# Patient Record
Sex: Female | Born: 1960 | Race: White | Hispanic: No | Marital: Married | Smoking: Never smoker
Health system: Southern US, Community
[De-identification: ages and names within clinical notes are randomized; demographics above are authoritative.]

## PROBLEM LIST (undated history)

## (undated) DIAGNOSIS — I10 Essential (primary) hypertension: Secondary | ICD-10-CM

## (undated) DIAGNOSIS — M8580 Other specified disorders of bone density and structure, unspecified site: Secondary | ICD-10-CM

## (undated) DIAGNOSIS — B001 Herpesviral vesicular dermatitis: Secondary | ICD-10-CM

## (undated) DIAGNOSIS — E079 Disorder of thyroid, unspecified: Secondary | ICD-10-CM

## (undated) DIAGNOSIS — N952 Postmenopausal atrophic vaginitis: Secondary | ICD-10-CM

## (undated) DIAGNOSIS — M810 Age-related osteoporosis without current pathological fracture: Secondary | ICD-10-CM

## (undated) HISTORY — DX: Essential (primary) hypertension: I10

## (undated) HISTORY — PX: CERVICAL BIOPSY  W/ LOOP ELECTRODE EXCISION: SUR135

## (undated) HISTORY — PX: CHOLECYSTECTOMY: SHX55

## (undated) HISTORY — PX: ABDOMINAL HYSTERECTOMY: SHX81

## (undated) HISTORY — PX: COLPOSCOPY: SHX161

## (undated) HISTORY — DX: Herpesviral vesicular dermatitis: B00.1

## (undated) HISTORY — DX: Other specified disorders of bone density and structure, unspecified site: M85.80

## (undated) HISTORY — DX: Postmenopausal atrophic vaginitis: N95.2

## (undated) HISTORY — PX: BACK SURGERY: SHX140

## (undated) HISTORY — DX: Age-related osteoporosis without current pathological fracture: M81.0

## (undated) HISTORY — PX: TONSILLECTOMY AND ADENOIDECTOMY: SHX28

## (undated) HISTORY — DX: Disorder of thyroid, unspecified: E07.9

---

## 1992-10-30 HISTORY — PX: ABDOMINAL HYSTERECTOMY: SHX81

## 1992-10-30 HISTORY — PX: TOTAL ABDOMINAL HYSTERECTOMY W/ BILATERAL SALPINGOOPHORECTOMY: SHX83

## 2005-03-07 ENCOUNTER — Ambulatory Visit: Payer: Self-pay | Admitting: Unknown Physician Specialty

## 2006-08-16 ENCOUNTER — Ambulatory Visit: Payer: Self-pay | Admitting: Unknown Physician Specialty

## 2007-02-02 ENCOUNTER — Ambulatory Visit: Payer: Self-pay | Admitting: Unknown Physician Specialty

## 2008-01-09 ENCOUNTER — Ambulatory Visit: Payer: Self-pay | Admitting: Unknown Physician Specialty

## 2008-01-14 ENCOUNTER — Ambulatory Visit: Payer: Self-pay | Admitting: Unknown Physician Specialty

## 2008-07-20 ENCOUNTER — Ambulatory Visit: Payer: Self-pay | Admitting: Unknown Physician Specialty

## 2009-07-27 ENCOUNTER — Ambulatory Visit: Payer: Self-pay | Admitting: Unknown Physician Specialty

## 2009-08-16 ENCOUNTER — Ambulatory Visit: Payer: Self-pay | Admitting: Unknown Physician Specialty

## 2010-08-09 ENCOUNTER — Ambulatory Visit: Payer: Self-pay | Admitting: Unknown Physician Specialty

## 2010-10-30 HISTORY — PX: COLONOSCOPY: SHX174

## 2011-04-24 ENCOUNTER — Ambulatory Visit: Payer: Self-pay | Admitting: Gastroenterology

## 2012-04-18 ENCOUNTER — Ambulatory Visit: Payer: Self-pay | Admitting: Unknown Physician Specialty

## 2012-10-30 DIAGNOSIS — M8580 Other specified disorders of bone density and structure, unspecified site: Secondary | ICD-10-CM

## 2012-10-30 HISTORY — DX: Other specified disorders of bone density and structure, unspecified site: M85.80

## 2014-07-05 ENCOUNTER — Inpatient Hospital Stay: Payer: Self-pay | Admitting: Surgery

## 2014-07-05 LAB — COMPREHENSIVE METABOLIC PANEL
ALK PHOS: 64 U/L
ANION GAP: 5 — AB (ref 7–16)
Albumin: 3 g/dL — ABNORMAL LOW (ref 3.4–5.0)
BILIRUBIN TOTAL: 0.3 mg/dL (ref 0.2–1.0)
BUN: 18 mg/dL (ref 7–18)
CREATININE: 1.12 mg/dL (ref 0.60–1.30)
Calcium, Total: 8.8 mg/dL (ref 8.5–10.1)
Chloride: 102 mmol/L (ref 98–107)
Co2: 31 mmol/L (ref 21–32)
EGFR (Non-African Amer.): 57 — ABNORMAL LOW
GLUCOSE: 88 mg/dL (ref 65–99)
Osmolality: 277 (ref 275–301)
Potassium: 3.6 mmol/L (ref 3.5–5.1)
SGOT(AST): 22 U/L (ref 15–37)
SGPT (ALT): 27 U/L
SODIUM: 138 mmol/L (ref 136–145)
TOTAL PROTEIN: 7.6 g/dL (ref 6.4–8.2)

## 2014-07-05 LAB — CBC WITH DIFFERENTIAL/PLATELET
BASOS ABS: 0 10*3/uL (ref 0.0–0.1)
BASOS PCT: 0.3 %
EOS ABS: 0.4 10*3/uL (ref 0.0–0.7)
EOS PCT: 3.1 %
HCT: 40 % (ref 35.0–47.0)
HGB: 13 g/dL (ref 12.0–16.0)
LYMPHS ABS: 3.2 10*3/uL (ref 1.0–3.6)
LYMPHS PCT: 23.8 %
MCH: 28.4 pg (ref 26.0–34.0)
MCHC: 32.4 g/dL (ref 32.0–36.0)
MCV: 88 fL (ref 80–100)
Monocyte #: 1 x10 3/mm — ABNORMAL HIGH (ref 0.2–0.9)
Monocyte %: 7.9 %
Neutrophil #: 8.6 10*3/uL — ABNORMAL HIGH (ref 1.4–6.5)
Neutrophil %: 64.9 %
PLATELETS: 352 10*3/uL (ref 150–440)
RBC: 4.56 10*6/uL (ref 3.80–5.20)
RDW: 12.5 % (ref 11.5–14.5)
WBC: 13.3 10*3/uL — ABNORMAL HIGH (ref 3.6–11.0)

## 2014-07-05 LAB — URINALYSIS, COMPLETE
BILIRUBIN, UR: NEGATIVE
Bacteria: NONE SEEN
Glucose,UR: NEGATIVE mg/dL (ref 0–75)
Ketone: NEGATIVE
NITRITE: NEGATIVE
Ph: 5 (ref 4.5–8.0)
Protein: NEGATIVE
RBC,UR: 20 /HPF (ref 0–5)
Specific Gravity: 1.028 (ref 1.003–1.030)
Squamous Epithelial: 7
WBC UR: 3 /HPF (ref 0–5)

## 2014-07-05 LAB — LIPASE, BLOOD: LIPASE: 134 U/L (ref 73–393)

## 2014-07-06 LAB — COMPREHENSIVE METABOLIC PANEL
ALK PHOS: 97 U/L
ANION GAP: 8 (ref 7–16)
AST: 74 U/L — AB (ref 15–37)
Albumin: 2.4 g/dL — ABNORMAL LOW (ref 3.4–5.0)
BILIRUBIN TOTAL: 0.3 mg/dL (ref 0.2–1.0)
BUN: 11 mg/dL (ref 7–18)
CALCIUM: 7.6 mg/dL — AB (ref 8.5–10.1)
CHLORIDE: 107 mmol/L (ref 98–107)
CO2: 27 mmol/L (ref 21–32)
Creatinine: 0.89 mg/dL (ref 0.60–1.30)
EGFR (African American): >60
GLUCOSE: 92 mg/dL (ref 65–99)
Osmolality: 282 (ref 275–301)
Potassium: 3.5 mmol/L (ref 3.5–5.1)
SGPT (ALT): 50 U/L
Sodium: 142 mmol/L (ref 136–145)
Total Protein: 6.1 g/dL — ABNORMAL LOW (ref 6.4–8.2)

## 2014-07-06 LAB — CBC WITH DIFFERENTIAL/PLATELET
BASOS PCT: 0.3 %
Basophil #: 0 10*3/uL (ref 0.0–0.1)
EOS PCT: 2.5 %
Eosinophil #: 0.2 10*3/uL (ref 0.0–0.7)
HCT: 33.6 % — ABNORMAL LOW (ref 35.0–47.0)
HGB: 11 g/dL — AB (ref 12.0–16.0)
LYMPHS PCT: 25 %
Lymphocyte #: 2.4 10*3/uL (ref 1.0–3.6)
MCH: 28.7 pg (ref 26.0–34.0)
MCHC: 32.6 g/dL (ref 32.0–36.0)
MCV: 88 fL (ref 80–100)
Monocyte #: 0.9 x10 3/mm (ref 0.2–0.9)
Monocyte %: 9.4 %
NEUTROS ABS: 6.1 10*3/uL (ref 1.4–6.5)
Neutrophil %: 62.8 %
Platelet: 238 10*3/uL (ref 150–440)
RBC: 3.82 10*6/uL (ref 3.80–5.20)
RDW: 12.3 % (ref 11.5–14.5)
WBC: 9.7 10*3/uL (ref 3.6–11.0)

## 2014-07-07 LAB — CBC WITH DIFFERENTIAL/PLATELET
BASOS ABS: 0 10*3/uL (ref 0.0–0.1)
Basophil %: 0 %
Eosinophil #: 0 10*3/uL (ref 0.0–0.7)
Eosinophil %: 0 %
HCT: 36 % (ref 35.0–47.0)
HGB: 11.8 g/dL — ABNORMAL LOW (ref 12.0–16.0)
LYMPHS ABS: 1.2 10*3/uL (ref 1.0–3.6)
Lymphocyte %: 9.5 %
MCH: 28.1 pg (ref 26.0–34.0)
MCHC: 32.7 g/dL (ref 32.0–36.0)
MCV: 86 fL (ref 80–100)
Monocyte #: 0.7 x10 3/mm (ref 0.2–0.9)
Monocyte %: 5.5 %
NEUTROS ABS: 10.6 10*3/uL — AB (ref 1.4–6.5)
Neutrophil %: 85 %
PLATELETS: 312 10*3/uL (ref 150–440)
RBC: 4.19 10*6/uL (ref 3.80–5.20)
RDW: 11.7 % (ref 11.5–14.5)
WBC: 12.5 10*3/uL — ABNORMAL HIGH (ref 3.6–11.0)

## 2014-07-07 LAB — COMPREHENSIVE METABOLIC PANEL
ALBUMIN: 2.8 g/dL — AB (ref 3.4–5.0)
Alkaline Phosphatase: 153 U/L — ABNORMAL HIGH
Anion Gap: 6 — ABNORMAL LOW (ref 7–16)
BUN: 9 mg/dL (ref 7–18)
Bilirubin,Total: 0.3 mg/dL (ref 0.2–1.0)
CALCIUM: 8.8 mg/dL (ref 8.5–10.1)
CO2: 28 mmol/L (ref 21–32)
CREATININE: 0.88 mg/dL (ref 0.60–1.30)
Chloride: 102 mmol/L (ref 98–107)
GLUCOSE: 144 mg/dL — AB (ref 65–99)
OSMOLALITY: 273 (ref 275–301)
Potassium: 4.1 mmol/L (ref 3.5–5.1)
SGOT(AST): 69 U/L — ABNORMAL HIGH (ref 15–37)
SGPT (ALT): 73 U/L — ABNORMAL HIGH
Sodium: 136 mmol/L (ref 136–145)
TOTAL PROTEIN: 7.1 g/dL (ref 6.4–8.2)

## 2014-07-08 LAB — COMPREHENSIVE METABOLIC PANEL
ALBUMIN: 2.3 g/dL — AB (ref 3.4–5.0)
ALK PHOS: 103 U/L
Anion Gap: 7 (ref 7–16)
BILIRUBIN TOTAL: 0.2 mg/dL (ref 0.2–1.0)
BUN: 11 mg/dL (ref 7–18)
Calcium, Total: 8.1 mg/dL — ABNORMAL LOW (ref 8.5–10.1)
Chloride: 106 mmol/L (ref 98–107)
Co2: 28 mmol/L (ref 21–32)
Creatinine: 0.9 mg/dL (ref 0.60–1.30)
EGFR (African American): >60
EGFR (Non-African Amer.): >60
Glucose: 81 mg/dL (ref 65–99)
Osmolality: 280 (ref 275–301)
Potassium: 3.6 mmol/L (ref 3.5–5.1)
SGOT(AST): 36 U/L (ref 15–37)
SGPT (ALT): 47 U/L
Sodium: 141 mmol/L (ref 136–145)
Total Protein: 6.1 g/dL — ABNORMAL LOW (ref 6.4–8.2)

## 2014-07-08 LAB — CBC WITH DIFFERENTIAL/PLATELET
BASOS ABS: 0 10*3/uL (ref 0.0–0.1)
Basophil %: 0.4 %
Eosinophil #: 0.2 10*3/uL (ref 0.0–0.7)
Eosinophil %: 1.5 %
HCT: 31.4 % — ABNORMAL LOW (ref 35.0–47.0)
HGB: 10.6 g/dL — ABNORMAL LOW (ref 12.0–16.0)
LYMPHS PCT: 39 %
Lymphocyte #: 3.9 10*3/uL — ABNORMAL HIGH (ref 1.0–3.6)
MCH: 29 pg (ref 26.0–34.0)
MCHC: 33.8 g/dL (ref 32.0–36.0)
MCV: 86 fL (ref 80–100)
MONO ABS: 0.6 x10 3/mm (ref 0.2–0.9)
MONOS PCT: 6 %
NEUTROS PCT: 53.1 %
Neutrophil #: 5.3 10*3/uL (ref 1.4–6.5)
Platelet: 260 10*3/uL (ref 150–440)
RBC: 3.67 10*6/uL — ABNORMAL LOW (ref 3.80–5.20)
RDW: 12.1 % (ref 11.5–14.5)
WBC: 10 10*3/uL (ref 3.6–11.0)

## 2014-07-09 LAB — PATHOLOGY REPORT

## 2015-02-20 NOTE — Op Note (Signed)
PATIENT NAME:  Diane Gross, Diane Gross MR#:  [id number] DATE OF BIRTH:  Oct 06, 1961  DATE OF PROCEDURE:  07/06/2014  PREOPERATIVE DIAGNOSIS: Acute cholecystitis.   POSTOPERATIVE DIAGNOSIS: Acute cholecystitis.   PROCEDURE: Laparoscopic cholecystectomy.   SURGEON: Dionne Miloichard Zanasia Hickson, MD   ANESTHESIA: General with endotracheal tube.   INDICATIONS: This is a patient with unrelenting right upper quadrant pain and a work-up showing acute cholecystitis with a gallstone lodged in the neck of the gallbladder. Preoperatively, we discussed rationale for surgery, the options of observation, risk of bleeding, infection, recurrence of symptoms, failure to resolve her symptoms, open procedure, bile duct damage, bile duct leak, and retained common bile duct stone, any of which could require further surgery and/or ERCP, stent, and papillotomy. This was reviewed for her. She understood and agreed to proceed.   FINDINGS: Acute cholecystitis, large gallstone in the neck of the gallbladder, extensive fibrous scarification of the gallbladder fossa and posterior wall of the gallbladder.   DESCRIPTION OF PROCEDURE: The patient was induced to general anesthesia. She was on IV antibiotics. She was prepped and draped in sterile fashion. VTE prophylaxis was in place.   Local anesthetic was infiltrated in the skin and subcutaneous tissues around the periumbilical area. Incision was made. A Veress needle was placed. Pneumoperitoneum was obtained and a 5 mm trocar port was placed under direct vision. A 10 mm epigastric port and two lateral 5 mm ports were placed, and the abdominal cavity was explored. There were some adhesions in the infraumbilical area away from the port site with no sign of bowel injury.   The gallbladder was placed on tension. It was found to be greatly edematous and swollen. Adhesions were present that were taken down bluntly without the use of energy. The infundibulum was identified. The cystic artery was well  identified, doubly clipped, and divided. This allowed for good visualization of a small cystic duct entering the infundibulum of the gallbladder, where there was a stone firmly lodged in the neck of the gallbladder in the infundibulum. The cystic duct was doubly clipped and divided and then the gallbladder segment of the gallbladder fossa. Minimal, if any, electrocautery was used in the infundibular area and cephalad until the fundus was reached because of extensive thick fibrous tissue in the posterior wall of the gallbladder and gallbladder fossa. Electrocautery was used at the fundus area which did not seem to be involved with this chronic inflammation. Hemostasis was with electrocautery. The gallbladder was passed out through the epigastric port site with the aid of an Endo Catch bag requiring enlargement of the epigastric site due to the large size of the gallstone and thickened gallbladder.   The area was checked for hemostasis and found to be adequate. There was no sign of bleeding. No bile leak. No bowel injury. Again, the camera was placed in the epigastric site to view back the periumbilical site. There was no sign of bowel injury. Therefore, pneumoperitoneum was released. All ports were removed. Fascial edges at the enlarged epigastric port site were approximated with figure-of-eight 0 Vicryls and then 4-0 subcuticular Monocryl was used on all skin edges. Steri-Strips, Mastisol, and sterile dressings were placed.   The patient tolerated the procedure well. There were no complications. She was taken to the recovery room in stable condition to be admitted for continued care.   ____________________________ Adah Salvageichard E. Excell Seltzerooper, MD rec:nb D: 07/06/2014 15:55:13 ET T: 07/06/2014 18:08:06 ET JOB#: [id number]  cc: Adah Salvageichard E. Excell Seltzerooper, MD, <Dictator> Lattie HawICHARD E Cerina Leary MD ELECTRONICALLY SIGNED 07/07/2014  9:02 

## 2015-02-20 NOTE — H&P (Signed)
History of Present Illness 54 yof who has been sick for 4 days (since Wednesday). She has had worsening RUQ pain, N, V, and anorexia (until just now). Possible low grade fever initially. No jaundice Sx.   Past Med/Surgical Hx:  Hypothyroidism:   ALLERGIES:  NKA: None  HOME MEDICATIONS: Medication Instructions Status  metroNIDAZOLE 500 mg oral tablet  orally every 8 hours Active  hyoscyamine 0.125 mg oral tablet 1 tab(s) orally every 8 hours Active  famotidine 20 mg oral tablet 1 tab(s) orally 2 times a day Active  Premarin 0.625 mg oral tablet 1 tab(s) orally once a day Active  Synthroid 50 mcg (0.05 mg) oral tablet 1 tab(s) orally once a day Active   Family and Social History:  Family History Non-Contributory   Social History negative tobacco, negative ETOH, negative Illicit drugs, married, 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, HR head for [employer]   Place of Living Home   Review of Systems:  Fever/Chills No   Cough No   Sputum No   Abdominal Pain Yes   Diarrhea No   Constipation No   Nausea/Vomiting Yes   SOB/DOE No   Chest Pain No   Dysuria No   Tolerating PT Yes   Tolerating Diet No  Nauseated  Vomiting  last meal 10:00 AM today   Physical Exam:  GEN well developed, well nourished, no acute distress   HEENT pink conjunctivae, PERRL, hearing intact to voice, dry oral mucosa, Oropharynx clear, good dentition   NECK supple  trachea midline   RESP normal resp effort  no use of accessory muscles   CARD regular rate  no murmur  no thrills  no JVD  no Rub   ABD positive tenderness  RUQ tenderness   EXTR negative cyanosis/clubbing, negative edema   SKIN normal to palpation, skin turgor good   NEURO cranial nerves intact, negative tremor, follows commands, motor/sensory function intact   PSYCH alert, A+O to time, place, person, good insight   Lab Results: Hepatic:  06-Sep-15 14:09   Bilirubin, Total 0.3  Alkaline Phosphatase 64 (46-116 NOTE: New Reference  Range 05/19/14)  SGPT (ALT) 27 (14-63 NOTE: New Reference Range 05/19/14)  SGOT (AST) 22  Total Protein, Serum 7.6  Albumin, Serum  3.0  Routine Chem:  06-Sep-15 14:09   Glucose, Serum 88  BUN 18  Creatinine (comp) 1.12  Sodium, Serum 138  Potassium, Serum 3.6  Chloride, Serum 102  CO2, Serum 31  Calcium (Total), Serum 8.8  Osmolality (calc) 277  eGFR (African American) >60  eGFR (Non-African American)  57 (eGFR values <34mL/min/1.73 m2 may be an indication of chronic kidney disease (CKD). Calculated eGFR is useful in patients with stable renal function. The eGFR calculation will not be reliable in acutely ill patients when serum creatinine is changing rapidly. It is not useful in  patients on dialysis. The eGFR calculation may not be applicable to patients at the low and high extremes of body sizes, pregnant women, and vegetarians.)  Anion Gap  5  Lipase 134 (Result(s) reported on 05 Jul 2014 at 02:59PM.)  Routine UA:  06-Sep-15 14:09   Color (UA) Yellow  Clarity (UA) Hazy  Glucose (UA) Negative  Bilirubin (UA) Negative  Ketones (UA) Negative  Specific Gravity (UA) 1.028  Blood (UA) 3+  pH (UA) 5.0  Protein (UA) Negative  Nitrite (UA) Negative  Leukocyte Esterase (UA) Trace (Result(s) reported on 05 Jul 2014 at 02:46PM.)  RBC (UA) 20 /HPF  WBC (UA)  3 /HPF  Bacteria (UA) NONE SEEN  Epithelial Cells (UA) 7 /HPF  Mucous (UA) PRESENT (Result(s) reported on 05 Jul 2014 at 02:46PM.)  Routine Hem:  06-Sep-15 14:09   WBC (CBC)  13.3  RBC (CBC) 4.56  Hemoglobin (CBC) 13.0  Hematocrit (CBC) 40.0  Platelet Count (CBC) 352  MCV 88  MCH 28.4  MCHC 32.4  RDW 12.5  Neutrophil % 64.9  Lymphocyte % 23.8  Monocyte % 7.9  Eosinophil % 3.1  Basophil % 0.3  Neutrophil #  8.6  Lymphocyte # 3.2  Monocyte #  1.0  Eosinophil # 0.4  Basophil # 0.0 (Result(s) reported on 05 Jul 2014 at 02:46PM.)   Radiology Results: [place]:    06-Sep-15 17:12, US Abdomen Limited Survey  US  Abdomen Limited Survey  REASON FOR EXAM:    epigastric pain w/ N/V  COMMENTS:   Body Site: Gallbladder, Liver, Common Bile Duct    PROCEDURE: [place]  - US ABDOMEN LIMITED SURVEY  - Jul 05 2014  5:12PM     CLINICAL DATA:  Epigastric pain with nausea and vomiting.    EXAM:  US ABDOMEN LIMITED - RIGHT UPPER QUADRANT    COMPARISON:  None.    FINDINGS:  Gallbladder:  Cholelithiasis with two moderate size gallstones present with 1 non  mobile stone at the gallbladder neck measuring 2.2 cm. Mild  gallbladder wall thickening measuring 4.8 mm. No adjacent free  fluid. Positive sonographic Murphy sign.    Common bile duct:    Diameter: 4.1 mm.    Liver:    No focal lesion identified. Within normal limits in parenchymal  echogenicity.     IMPRESSION:  Moderate cholelithiasis with a 2.2 cm stone over the gallbladder  neck. Mild associated gallbladder wall thickening with positive  sonographic Murphy's sign. Findings may be due to acute  cholecystitis.      Electronically Signed    By: Marin Olp M.D.    On: 07/05/2014 17:21         Verified By: Pearletha Alfred, M.D.,  LabUnknown:  PACS Image    Assessment/Admission Diagnosis Acute cholecystitis   Plan Admit, IVF, ABx Lap CCY Pt and husband understand low likelihood of converting to open procedure, reasoning for such, and 1/200 risk of CBD injury (and severity of such); she agrees.   Electronic Signatures: Consuela Mimes (MD)  (Signed 06-Sep-15 18:30)  Authored: CHIEF COMPLAINT and HISTORY, PAST MEDICAL/SURGIAL HISTORY, ALLERGIES, HOME MEDICATIONS, FAMILY AND SOCIAL HISTORY, REVIEW OF SYSTEMS, PHYSICAL EXAM, LABS, Radiology, ASSESSMENT AND PLAN   Last Updated: 06-Sep-15 18:30 by Consuela Mimes (MD)

## 2015-02-20 NOTE — Discharge Summary (Signed)
PATIENT NAME:  Diane Gross, Diane Gross MR#:  [id number] DATE OF BIRTH:  07/16/1961  DATE OF ADMISSION:  07/05/2014 DATE OF DISCHARGE:  07/08/2014  DIAGNOSES: Hypothyroidism, acute cholecystitis with cholelithiasis.  PROCEDURE: Laparoscopic cholecystectomy.   HISTORY OF PRESENT ILLNESS AND HOSPITAL COURSE: This is a patient with abdominal pain in the right upper quadrant and work-up showing acute cholecystitis. She was taken to the Operating Room where laparoscopic cholecystectomy was performed without incident.  She made an uncomplicated postoperative recovery, was treated with IV antibiotics, and was discharged in stable condition to follow up in our office in 10 days with oral analgesics and instructions for wound care.    ____________________________ Adah Salvageichard E. Excell Seltzerooper, MD rec:lr D: 07/08/2014 14:44:56 ET T: 07/08/2014 17:33:01 ET JOB#: [id number]  cc: Adah Salvageichard E. Excell Seltzerooper, MD, <Dictator> Lattie HawICHARD E Stephen Turnbaugh MD ELECTRONICALLY SIGNED 07/08/2014 18:21

## 2016-05-25 LAB — HM MAMMOGRAPHY: HM MAMMO: ABNORMAL — AB (ref 0–4)

## 2016-08-30 LAB — HM PAP SMEAR: HM PAP: NEGATIVE

## 2017-04-27 ENCOUNTER — Telehealth: Payer: Self-pay

## 2017-04-27 ENCOUNTER — Other Ambulatory Visit: Payer: Self-pay | Admitting: Certified Nurse Midwife

## 2017-04-27 NOTE — Telephone Encounter (Signed)
What is her pharmacy?

## 2017-04-27 NOTE — Telephone Encounter (Signed)
Called to ask pt what her pharm is and she stated she had taken care of everything.

## 2017-04-27 NOTE — Telephone Encounter (Signed)
Pt needs rx for either zovirax or valtrex for fever blister.  [place] the beach [place] and a fever blister started appearing last night.  [phone]

## 2017-05-10 ENCOUNTER — Other Ambulatory Visit: Payer: Self-pay | Admitting: Endocrinology

## 2017-05-10 DIAGNOSIS — Z1231 Encounter for screening mammogram for malignant neoplasm of breast: Secondary | ICD-10-CM

## 2017-05-15 ENCOUNTER — Ambulatory Visit
Admission: RE | Admit: 2017-05-15 | Discharge: 2017-05-15 | Disposition: A | Discharge status: Home / Self Care | Payer: PRIVATE HEALTH INSURANCE | Source: Ambulatory Visit | Attending: Endocrinology | Admitting: Endocrinology

## 2017-05-15 ENCOUNTER — Other Ambulatory Visit: Payer: Self-pay | Admitting: Endocrinology

## 2017-05-15 DIAGNOSIS — R928 Other abnormal and inconclusive findings on diagnostic imaging of breast: Secondary | ICD-10-CM | POA: Diagnosis not present

## 2017-05-15 DIAGNOSIS — N6320 Unspecified lump in the left breast, unspecified quadrant: Secondary | ICD-10-CM | POA: Insufficient documentation

## 2017-05-15 DIAGNOSIS — Z1231 Encounter for screening mammogram for malignant neoplasm of breast: Secondary | ICD-10-CM

## 2017-05-22 ENCOUNTER — Other Ambulatory Visit: Payer: Self-pay | Admitting: Obstetrics and Gynecology

## 2017-05-22 DIAGNOSIS — N6320 Unspecified lump in the left breast, unspecified quadrant: Secondary | ICD-10-CM

## 2017-05-22 DIAGNOSIS — R928 Other abnormal and inconclusive findings on diagnostic imaging of breast: Secondary | ICD-10-CM

## 2017-05-25 ENCOUNTER — Ambulatory Visit
Admission: RE | Admit: 2017-05-25 | Discharge: 2017-05-25 | Disposition: A | Discharge status: Home / Self Care | Payer: PRIVATE HEALTH INSURANCE | Source: Ambulatory Visit | Attending: Obstetrics and Gynecology | Admitting: Obstetrics and Gynecology

## 2017-05-25 DIAGNOSIS — R928 Other abnormal and inconclusive findings on diagnostic imaging of breast: Secondary | ICD-10-CM

## 2017-05-25 DIAGNOSIS — N6320 Unspecified lump in the left breast, unspecified quadrant: Secondary | ICD-10-CM | POA: Diagnosis not present

## 2017-05-28 ENCOUNTER — Encounter: Payer: Self-pay | Admitting: Obstetrics and Gynecology

## 2017-06-30 MED ORDER — VALACYCLOVIR HCL 1 G PO TABS: ORAL_TABLET | ORAL | 2 refills | Status: DC

## 2017-09-05 ENCOUNTER — Other Ambulatory Visit: Payer: Self-pay | Admitting: Obstetrics and Gynecology

## 2017-09-05 ENCOUNTER — Telehealth: Payer: Self-pay

## 2017-09-05 MED ORDER — ESTRADIOL 0.5 MG PO TABS: 0.5000 mg | ORAL_TABLET | Freq: Every day | ORAL | 1 refills | Status: DC

## 2017-09-05 NOTE — Telephone Encounter (Signed)
Done

## 2017-09-05 NOTE — Telephone Encounter (Signed)
Pt has AE w/[facility] 10/15/17 & needs a refill on Estradiol sent to [facility]. Cb#[phone].

## 2017-09-05 NOTE — Progress Notes (Signed)
Rx RF till annual 

## 2017-09-07 NOTE — Telephone Encounter (Signed)
Patient states estradiol Rx is not at her pharmacy, if this could be resent, she is going out of town and needs it asap.

## 2017-09-07 NOTE — Telephone Encounter (Signed)
Notified pt that I had spoke w/pharmacy & rx is ready for p/u. It was sent & received by pharmacy on 09/05/17 ~3pm. Per pharmacy, it was on hold d/t insurance.

## 2017-10-15 ENCOUNTER — Encounter: Payer: Self-pay | Admitting: Obstetrics and Gynecology

## 2017-10-15 ENCOUNTER — Ambulatory Visit (INDEPENDENT_AMBULATORY_CARE_PROVIDER_SITE_OTHER): Payer: PRIVATE HEALTH INSURANCE | Admitting: Obstetrics and Gynecology

## 2017-10-15 VITALS — BP 118/66 | HR 82 | Ht 64.0 in | Wt 180.0 lb

## 2017-10-15 DIAGNOSIS — Z79890 Hormone replacement therapy (postmenopausal): Secondary | ICD-10-CM

## 2017-10-15 DIAGNOSIS — Z1231 Encounter for screening mammogram for malignant neoplasm of breast: Secondary | ICD-10-CM

## 2017-10-15 DIAGNOSIS — Z01419 Encounter for gynecological examination (general) (routine) without abnormal findings: Secondary | ICD-10-CM

## 2017-10-15 DIAGNOSIS — N951 Menopausal and female climacteric states: Secondary | ICD-10-CM

## 2017-10-15 DIAGNOSIS — Z1239 Encounter for other screening for malignant neoplasm of breast: Secondary | ICD-10-CM

## 2017-10-15 NOTE — Progress Notes (Signed)
PCP: Patient, No Pcp Per   Chief Complaint  Patient presents with  . Gynecologic Exam    HPI:      [name] is a 56 y.o. No obstetric history on file. who LMP was No LMP recorded. Patient has had a hysterectomy., presents today for her annual examination.  Her menses are absent due to menopause. She does not have intermenstrual bleeding.  She does not have vasomotor sx. She uses estradiol 0.5 mg daily with sx control. Weaned down from 0.626 mg last yr easily.   Sex activity: single partner, contraception - status post hysterectomy. She does not have vaginal dryness.  Last Pap: September 09, 2016  Results were: no abnormalities /neg HPV DNA.  Hx of STDs: none  Last mammogram: May 25, 2017  Results were: normal--routine follow-up in 12 months There is no FH of breast cancer. There is no FH of ovarian cancer. The patient does do self-breast exams.  Colonoscopy: colonoscopy 4 years ago without abnormalities. . Repeat due after 10 years.  DEXA: osteoporosis; yearly IV infusions with Dr. Patrecia PaceMorayati.  Tobacco use: The patient denies current or previous tobacco use. Alcohol use: none Exercise: moderately active  She does get adequate calcium and Vitamin D in her diet.  Labs with PCP.   Past Medical History:  Diagnosis Date  . Atrophic vaginitis   . Hypertension   . Osteopenia 2014  . Osteoporosis   . Thyroid disease    Hypothyroidism    Past Surgical History:  Procedure Laterality Date  . ABDOMINAL HYSTERECTOMY  1994   TAH BSO  . BACK SURGERY    . CERVICAL BIOPSY  W/ LOOP ELECTRODE EXCISION    . CHOLECYSTECTOMY    . COLONOSCOPY  2012  . COLPOSCOPY    . TONSILLECTOMY AND ADENOIDECTOMY      Family History  Problem Relation Age of Onset  . Breast cancer Neg Hx     Social History   Socioeconomic History  . Marital status: Married    Spouse name: Not on file  . Number of children: Not on file  . Years of education: Not on file  . Highest education  level: Not on file  Social Needs  . Financial resource strain: Not on file  . Food insecurity - worry: Not on file  . Food insecurity - inability: Not on file  . Transportation needs - medical: Not on file  . Transportation needs - non-medical: Not on file  Occupational History  . Not on file  Tobacco Use  . Smoking status: Never Smoker  . Smokeless tobacco: Never Used  Substance and Sexual Activity  . Alcohol use: Yes  . Drug use: No  . Sexual activity: Yes    Birth control/protection: Surgical    Comment: Hysterectomy  Other Topics Concern  . Not on file  Social History Narrative  . Not on file    Current Meds  Medication Sig  . estradiol (ESTRACE) 0.5 MG tablet Take 1 tablet (0.5 mg total) daily by mouth.  . valACYclovir (VALTREX) 1000 MG tablet Take 2 tablets every 12 hours prn cold sores      ROS:  Review of Systems  Constitutional: Negative for fatigue, fever and unexpected weight change.  Respiratory: Negative for cough, shortness of breath and wheezing.   Cardiovascular: Negative for chest pain, palpitations and leg swelling.  Gastrointestinal: Negative for blood in stool, constipation, diarrhea, nausea and vomiting.  Endocrine: Negative for cold intolerance, heat intolerance and polyuria.  Genitourinary: Negative for dyspareunia, dysuria, flank pain, frequency, genital sores, hematuria, menstrual problem, pelvic pain, urgency, vaginal bleeding, vaginal discharge and vaginal pain.  Musculoskeletal: Negative for back pain, joint swelling and myalgias.  Skin: Negative for rash.  Neurological: Negative for dizziness, syncope, light-headedness, numbness and headaches.  Hematological: Negative for adenopathy.  Psychiatric/Behavioral: Negative for agitation, confusion, sleep disturbance and suicidal ideas. The patient is not nervous/anxious.      Objective: BP 118/66 (BP Location: Left Arm, Patient Position: Sitting, Cuff Size: Normal)   Pulse 82   Ht 5\' 4"   (1.626 m)   Wt 180 lb (81.6 kg)   BMI 30.90 kg/m    Physical Exam  Constitutional: She is oriented to person, place, and time. She appears well-developed and well-nourished.  Genitourinary: Vagina normal and uterus normal. There is no rash or tenderness on the right labia. There is no rash or tenderness on the left labia. No erythema or tenderness in the vagina. No vaginal discharge found. Right adnexum does not display mass and does not display tenderness. Left adnexum does not display mass and does not display tenderness. Cervix does not exhibit motion tenderness or polyp. Uterus is not enlarged or tender.  Neck: Normal range of motion. No thyromegaly present.  Cardiovascular: Normal rate, regular rhythm and normal heart sounds.  No murmur heard. Pulmonary/Chest: Effort normal and breath sounds normal. Right breast exhibits no mass, no nipple discharge, no skin change and no tenderness. Left breast exhibits no mass, no nipple discharge, no skin change and no tenderness.  Abdominal: Soft. There is no tenderness. There is no guarding.  Musculoskeletal: Normal range of motion.  Neurological: She is alert and oriented to person, place, and time. No cranial nerve deficit.  Psychiatric: She has a normal mood and affect. Her behavior is normal.  Vitals reviewed.   Assessment/Plan:  Encounter for annual routine gynecological examination  Screening for breast cancer - Pt to sched mammo 7/19 - Plan: MM DIGITAL SCREENING BILATERAL  Vasomotor symptoms due to menopause - Improved with HRT. Wean estradiol down to 0.25 mg (1/2 tab). Will RF Rx based on sx.   Hormone replacement therapy (HRT)         GYN counsel breast self exam, mammography screening, use and side effects of HRT, adequate intake of calcium and vitamin D, diet and exercise    F/U  Return in about 1 year (around 10/15/2018).  Alicia B. Copland, PA-C 10/15/2017 2:38 PM

## 2017-10-15 NOTE — Patient Instructions (Signed)
I value your feedback and entrusting us with your care. If you get a [facility] patient survey, I would appreciate you taking the time to let us know about your experience today. Thank you! 

## 2017-10-31 ENCOUNTER — Other Ambulatory Visit: Payer: Self-pay

## 2017-10-31 MED ORDER — ESTRADIOL 0.5 MG PO TABS: 0.5000 mg | ORAL_TABLET | Freq: Every day | ORAL | 1 refills | Status: DC

## 2018-08-20 ENCOUNTER — Other Ambulatory Visit: Payer: Self-pay | Admitting: Obstetrics and Gynecology

## 2018-08-20 DIAGNOSIS — Z1231 Encounter for screening mammogram for malignant neoplasm of breast: Secondary | ICD-10-CM

## 2018-08-24 ENCOUNTER — Other Ambulatory Visit: Payer: Self-pay | Admitting: Obstetrics and Gynecology

## 2018-09-03 ENCOUNTER — Ambulatory Visit
Admission: RE | Admit: 2018-09-03 | Discharge: 2018-09-03 | Disposition: A | Discharge status: Home / Self Care | Payer: PRIVATE HEALTH INSURANCE | Source: Ambulatory Visit | Attending: Obstetrics and Gynecology | Admitting: Obstetrics and Gynecology

## 2018-09-03 DIAGNOSIS — Z1231 Encounter for screening mammogram for malignant neoplasm of breast: Secondary | ICD-10-CM | POA: Insufficient documentation

## 2018-09-04 ENCOUNTER — Encounter: Payer: Self-pay | Admitting: Obstetrics and Gynecology

## 2018-10-15 NOTE — Patient Instructions (Signed)
I value your feedback and entrusting us with your care. If you get a [facility] patient survey, I would appreciate you taking the time to let us know about your experience today. Thank you! 

## 2018-10-15 NOTE — Progress Notes (Signed)
PCP: [facility] [place]   Chief Complaint  Patient presents with  . Gynecologic Exam    has been having tenderness on left breast x a few months, had mammogram done about a month ago and all looked good, but shes not comfortable, doesnt feel any lumps    HPI:      [name] is a 57 y.o. No obstetric history on file. who LMP was No LMP recorded. Patient has had a hysterectomy., presents today for her annual examination.  Her menses are absent due to menopause/TAHBSO. She does not have intermenstrual bleeding.  She does have occas vasomotor sx. She uses estradiol 0.25 mg daily (1/2 tab 0.5 mg) with mostly sx control. Weaned down from 0.5 mg last yr and doing well enough.    Sex activity: single partner, contraception - status post hysterectomy. She does not have vaginal dryness.  Last Pap: 09/09/16 Results were: no abnormalities /neg HPV DNA. Needs yearly paps for insurance. Hx of STDs: none  Last mammogram: 09/04/18  Results were: normal--routine follow-up in 12 months There is no FH of breast cancer. There is no FH of ovarian cancer. The patient does do self-breast exams. Has had LT breast tenderness near her nipple, without mass, for several months. Had normal mammo 11/19. Had LUOQ breast u/s 2018 after abn mammo that was normal. Pt drinks caffeine.  Colonoscopy: colonoscopy 7 years ago without abnormalities.  Repeat due after 10 years.  DEXA: osteoporosis; yearly IV infusions with Dr. Patrecia Pace.  Tobacco use: The patient denies current or previous tobacco use. Alcohol use: none Exercise: min active  She does get adequate calcium and Vitamin D in her diet.  Labs/meds with Dr. Patrecia Pace.   Past Medical History:  Diagnosis Date  . Atrophic vaginitis   . Hypertension   . Osteopenia 2014  . Osteoporosis   . Thyroid disease    Hypothyroidism    Past Surgical History:  Procedure Laterality Date  . ABDOMINAL HYSTERECTOMY  1994   TAH BSO  . BACK SURGERY      . CERVICAL BIOPSY  W/ LOOP ELECTRODE EXCISION    . CHOLECYSTECTOMY    . COLONOSCOPY  2012  . COLPOSCOPY    . TONSILLECTOMY AND ADENOIDECTOMY      Family History  Problem Relation Age of Onset  . Heart disease Mother   . Osteoporosis Mother   . Cancer Father        lungs, malignant  . Breast cancer Neg Hx     Social History   Socioeconomic History  . Marital status: Married    Spouse name: Not on file  . Number of children: Not on file  . Years of education: Not on file  . Highest education level: Not on file  Occupational History  . Not on file  Social Needs  . Financial resource strain: Not on file  . Food insecurity:    Worry: Not on file    Inability: Not on file  . Transportation needs:    Medical: Not on file    Non-medical: Not on file  Tobacco Use  . Smoking status: Never Smoker  . Smokeless tobacco: Never Used  Substance and Sexual Activity  . Alcohol use: Yes    Comment: occ  . Drug use: No  . Sexual activity: Yes    Birth control/protection: Surgical    Comment: Hysterectomy  Lifestyle  . Physical activity:    Days per week: 4 days    Minutes  per session: 50 min  . Stress: Not at all  Relationships  . Social connections:    Talks on phone: More than three times a week    Gets together: More than three times a week    Attends religious service: More than 4 times per year    Active member of club or organization: Yes    Attends meetings of clubs or organizations: More than 4 times per year    Relationship status: Married  . Intimate partner violence:    Fear of current or ex partner: No    Emotionally abused: No    Physically abused: No    Forced sexual activity: No  Other Topics Concern  . Not on file  Social History Narrative  . Not on file    Current Meds  Medication Sig  . estradiol (ESTRACE) 0.5 MG tablet Take 0.5 tablets (0.25 mg total) by mouth daily.  . hydrochlorothiazide (HYDRODIURIL) 12.5 MG tablet Take by mouth.  .  levothyroxine (SYNTHROID) 75 MCG tablet TAKE 1 BY MOUTH EVERY MORNING  . Vitamin D, Ergocalciferol, (DRISDOL) 1.25 MG (50000 UT) CAPS capsule Take by mouth.  . [DISCONTINUED] estradiol (ESTRACE) 0.5 MG tablet TAKE 1 TABLET BY MOUTH EVERY DAY  . [DISCONTINUED] estradiol (ESTRACE) 0.5 MG tablet Take 1 tablet (0.5 mg total) by mouth daily.      ROS:  Review of Systems  Constitutional: Negative for fatigue, fever and unexpected weight change.  Respiratory: Negative for cough, shortness of breath and wheezing.   Cardiovascular: Negative for chest pain, palpitations and leg swelling.  Gastrointestinal: Negative for blood in stool, constipation, diarrhea, nausea and vomiting.  Endocrine: Negative for cold intolerance, heat intolerance and polyuria.  Genitourinary: Negative for dyspareunia, dysuria, flank pain, frequency, genital sores, hematuria, menstrual problem, pelvic pain, urgency, vaginal bleeding, vaginal discharge and vaginal pain.  Musculoskeletal: Negative for back pain, joint swelling and myalgias.  Skin: Negative for rash.  Neurological: Negative for dizziness, syncope, light-headedness, numbness and headaches.  Hematological: Negative for adenopathy.  Psychiatric/Behavioral: Negative for agitation, confusion, sleep disturbance and suicidal ideas. The patient is not nervous/anxious.   BREAST: pos for tenderness   Objective: BP 116/70   Pulse 70   Ht 5' 3.5" (1.613 m)   Wt 185 lb (83.9 kg)   BMI 32.26 kg/m    Physical Exam Constitutional:      Appearance: She is well-developed.  Genitourinary:     Vulva and vagina normal.     No vaginal discharge, erythema or tenderness.     No right or left adnexal mass present.     Right adnexa not tender.     Left adnexa not tender.     Genitourinary Comments: UTERUS/CX SURG REM  Neck:     Musculoskeletal: Normal range of motion.     Thyroid: No thyromegaly.  Cardiovascular:     Rate and Rhythm: Normal rate and regular rhythm.       Heart sounds: Normal heart sounds. No murmur.  Pulmonary:     Effort: Pulmonary effort is normal.     Breath sounds: Normal breath sounds.  Chest:     Breasts:        Right: No mass, nipple discharge, skin change or tenderness.        Left: Tenderness present. No mass, nipple discharge or skin change.    Abdominal:     Palpations: Abdomen is soft.     Tenderness: There is no abdominal tenderness. There is no guarding.  Musculoskeletal:  Normal range of motion.  Neurological:     Mental Status: She is alert and oriented to person, place, and time.     Cranial Nerves: No cranial nerve deficit.  Psychiatric:        Behavior: Behavior normal.  Vitals signs reviewed.     Assessment/Plan:  Encounter for annual routine gynecological examination  Cervical cancer screening - Pt needs for ins. - Plan: Cytology - PAP  Screening for breast cancer - Pt current on mammo  Vasomotor symptoms due to menopause - Rx RF estradiol 0.25 mg. Vasomotor sx are tolerable/mild on current ERT dose. Cont for now - Plan: estradiol (ESTRACE) 0.5 MG tablet, DISCONTINUED: estradiol (ESTRACE) 0.5 MG tablet  Hormone replacement therapy (HRT) - Plan: estradiol (ESTRACE) 0.5 MG tablet, DISCONTINUED: estradiol (ESTRACE) 0.5 MG tablet  Breast tenderness - LT. No masses on exam. D/C caffeine. F/u if sx persist for further eval.         Meds ordered this encounter  Medications  . DISCONTD: estradiol (ESTRACE) 0.5 MG tablet    Sig: Take 1 tablet (0.5 mg total) by mouth daily.    Dispense:  90 tablet    Refill:  0    Order Specific Question:   Supervising Provider    Answer:   Nadara Mustard [id number]  . estradiol (ESTRACE) 0.5 MG tablet    Sig: Take 0.5 tablets (0.25 mg total) by mouth daily.    Dispense:  90 tablet    Refill:  1    USE THIS RX    Order Specific Question:   Supervising Provider    Answer:   Nadara Mustard [[id number]]     GYN counsel breast self exam, mammography screening, use and  side effects of HRT, adequate intake of calcium and vitamin D, diet and exercise    F/U  Return in about 1 year (around 10/17/2019).   B. , PA-C 10/16/2018 9:07 AM

## 2018-10-16 ENCOUNTER — Encounter: Payer: Self-pay | Admitting: Obstetrics and Gynecology

## 2018-10-16 ENCOUNTER — Other Ambulatory Visit (HOSPITAL_COMMUNITY)
Admission: RE | Admit: 2018-10-16 | Discharge: 2018-10-16 | Disposition: A | Discharge status: Home / Self Care | Payer: PRIVATE HEALTH INSURANCE | Source: Ambulatory Visit | Attending: Obstetrics and Gynecology | Admitting: Obstetrics and Gynecology

## 2018-10-16 ENCOUNTER — Ambulatory Visit (INDEPENDENT_AMBULATORY_CARE_PROVIDER_SITE_OTHER): Payer: PRIVATE HEALTH INSURANCE | Admitting: Obstetrics and Gynecology

## 2018-10-16 VITALS — BP 116/70 | HR 70 | Ht 63.5 in | Wt 185.0 lb

## 2018-10-16 DIAGNOSIS — Z79890 Hormone replacement therapy (postmenopausal): Secondary | ICD-10-CM

## 2018-10-16 DIAGNOSIS — Z1239 Encounter for other screening for malignant neoplasm of breast: Secondary | ICD-10-CM

## 2018-10-16 DIAGNOSIS — Z124 Encounter for screening for malignant neoplasm of cervix: Secondary | ICD-10-CM | POA: Diagnosis not present

## 2018-10-16 DIAGNOSIS — Z01419 Encounter for gynecological examination (general) (routine) without abnormal findings: Secondary | ICD-10-CM | POA: Diagnosis not present

## 2018-10-16 DIAGNOSIS — N644 Mastodynia: Secondary | ICD-10-CM

## 2018-10-16 DIAGNOSIS — N951 Menopausal and female climacteric states: Secondary | ICD-10-CM

## 2018-10-16 MED ORDER — ESTRADIOL 0.5 MG PO TABS: 0.2500 mg | ORAL_TABLET | Freq: Every day | ORAL | 1 refills | Status: DC

## 2018-10-16 MED ORDER — ESTRADIOL 0.5 MG PO TABS: 0.5000 mg | ORAL_TABLET | Freq: Every day | ORAL | 0 refills | Status: DC

## 2018-10-21 LAB — CYTOLOGY - PAP: DIAGNOSIS: NEGATIVE

## 2019-05-21 ENCOUNTER — Other Ambulatory Visit: Payer: Self-pay | Admitting: Internal Medicine

## 2019-05-21 DIAGNOSIS — Z20822 Contact with and (suspected) exposure to covid-19: Secondary | ICD-10-CM

## 2019-05-24 LAB — NOVEL CORONAVIRUS, NAA: SARS-CoV-2, NAA: NOT DETECTED

## 2019-10-07 ENCOUNTER — Other Ambulatory Visit: Payer: Self-pay

## 2019-10-07 DIAGNOSIS — Z20822 Contact with and (suspected) exposure to covid-19: Secondary | ICD-10-CM

## 2019-10-09 LAB — NOVEL CORONAVIRUS, NAA: SARS-CoV-2, NAA: NOT DETECTED

## 2019-11-19 NOTE — Progress Notes (Signed)
[place]P: [facility] [place]   Chief Complaint  Patient presents with  . Gynecologic Exam    HPI:      [name] is a 59 y.o. No obstetric history on file. who LMP was No LMP recorded. Patient has had a hysterectomy., presents today for her annual examination.  Her menses are absent due to menopause/TAHBSO with LOA in 1994. She does not have intermenstrual bleeding.  She does have tolerable vasomotor sx. Was on estradiol but weaned off last yr. Doesn't want to continue ERT.    Sex activity: single partner, contraception - status post hysterectomy. She does have vaginal dryness and uses lubricants with some improvement.  Last Pap: 10/16/18 Results were: no abnormalities / 2018 neg HPV DNA. Pt wanted them in past due to insurance requirement.  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 There is no FH of ovarian cancer. The patient does do self-breast exams. Breast tenderness from last yr improved.  Colonoscopy: colonoscopy 2012 without abnormalities.  Repeat due after 10 years.  DEXA: osteoporosis; yearly IV infusions with Dr. Ronnald Collum.  Tobacco use: The patient denies current or previous tobacco use. Alcohol use: occas No drug use Exercise: min active  Had lost significant wt last yr with diet changes but gained some back this yr. Plans to restart.  She does get adequate calcium and Vitamin D in her diet.  Labs/meds with Dr. Ronnald Collum.   Past Medical History:  Diagnosis Date  . Atrophic vaginitis   . Hypertension   . Osteopenia 2014  . Osteoporosis   . Thyroid disease    Hypothyroidism    Past Surgical History:  Procedure Laterality Date  . BACK SURGERY    . CERVICAL BIOPSY  W/ LOOP ELECTRODE EXCISION    . CHOLECYSTECTOMY    . COLONOSCOPY  2012  . COLPOSCOPY    . TONSILLECTOMY AND ADENOIDECTOMY    . TOTAL ABDOMINAL HYSTERECTOMY W/ BILATERAL  SALPINGOOPHORECTOMY  1994   TAH BSO    Family History  Problem Relation Age of Onset  . Heart disease Mother   . Osteoporosis Mother   . Cancer Father        lungs, malignant  . Breast cancer Neg Hx     Social History   Socioeconomic History  . Marital status: Married    Spouse name: Not on file  . Number of children: Not on file  . Years of education: Not on file  . Highest education level: Not on file  Occupational History  . Not on file  Tobacco Use  . Smoking status: Never Smoker  . Smokeless tobacco: Never Used  Substance and Sexual Activity  . Alcohol use: Yes    Comment: occ  . Drug use: No  . Sexual activity: Yes    Birth control/protection: Surgical    Comment: Hysterectomy  Other Topics Concern  . Not on file  Social History Narrative  . Not on file   Social Determinants of Health   Financial Resource Strain:   . Difficulty of Paying Living Expenses: Not on file  Food Insecurity:   . Worried About Charity fundraiser in the Last Year: Not on file  . Ran Out of Food in the Last Year: Not on file  Transportation Needs:   . Lack of Transportation (Medical): Not on file  . Lack of Transportation (Non-Medical): Not on file  Physical Activity:   . Days of Exercise per Week: Not on file  . Minutes of Exercise per Session: Not on file  Stress:   . Feeling of Stress : Not on file  Social Connections:   . Frequency of Communication with Friends and Family: Not on file  . Frequency of Social Gatherings with Friends and Family: Not on file  . Attends Religious Services: Not on file  . Active Member of Clubs or Organizations: Not on file  . Attends Banker Meetings: Not on file  . Marital Status: Not on file  Intimate Partner Violence:   . Fear of Current or Ex-Partner: Not on file  . Emotionally Abused: Not on file  . Physically Abused: Not on file  . Sexually Abused: Not on file    Current Meds  Medication Sig  . ezetimibe (ZETIA) 10 MG  tablet Take 10 mg by mouth daily.  . furosemide (LASIX) 20 MG tablet Take 20 mg by mouth daily.  Marland Kitchen levothyroxine (SYNTHROID) 75 MCG tablet TAKE 1 BY MOUTH EVERY MORNING  . Vitamin D, Ergocalciferol, (DRISDOL) 1.25 MG (50000 UT) CAPS capsule Take by mouth.      ROS:  Review of Systems  Constitutional: Negative for fatigue, fever and unexpected weight change.  Respiratory: Negative for cough, shortness of breath and wheezing.   Cardiovascular: Negative for chest pain, palpitations and leg swelling.  Gastrointestinal: Positive for constipation. Negative for blood in stool, diarrhea, nausea and vomiting.  Endocrine: Negative for cold intolerance, heat intolerance and polyuria.  Genitourinary: Negative for dyspareunia, dysuria, flank pain, frequency, genital sores, hematuria, menstrual problem, pelvic pain, urgency, vaginal bleeding, vaginal discharge and vaginal pain.  Musculoskeletal: Negative for back pain, joint swelling and myalgias.  Skin: Negative for rash.  Neurological: Negative for dizziness, syncope, light-headedness, numbness and headaches.  Hematological: Negative for adenopathy.  Psychiatric/Behavioral: Negative for agitation, confusion, sleep disturbance and suicidal ideas. The patient is not nervous/anxious.     Objective: BP 100/80   Ht 5\' 4"  (1.626 m)   Wt 183 lb (83 kg)   BMI 31.41 kg/m    Physical Exam Constitutional:      Appearance: She is well-developed.  Genitourinary:     Vulva and vagina normal.     Vaginal atrophic mucosa present.     No vaginal discharge, erythema or tenderness.     Cervix is absent.     Uterus is absent.     No right or left adnexal mass present.     Right adnexa absent.     Right adnexa not tender.     Left adnexa absent.     Left adnexa not tender.     Genitourinary Comments: UTERUS/CX SURG REM  Neck:     Thyroid: No thyromegaly.  Cardiovascular:     Rate and Rhythm: Normal rate and regular rhythm.     Heart sounds: Normal  heart sounds. No murmur.  Pulmonary:     Effort: Pulmonary effort is normal.     Breath sounds: Normal breath sounds.  Chest:     Breasts:        Right: No mass, nipple discharge, skin change or tenderness.        Left: No mass, nipple discharge, skin change or tenderness.  Abdominal:     Palpations: Abdomen is soft.     Tenderness: There is no abdominal tenderness. There is no guarding.  Musculoskeletal:        General: Normal range of motion.  Cervical back: Normal range of motion.  Neurological:     General: No focal deficit present.     Mental Status: She is alert and oriented to person, place, and time.     Cranial Nerves: No cranial nerve deficit.  Skin:    General: Skin is warm and dry.  Psychiatric:        Mood and Affect: Mood normal.        Behavior: Behavior normal.        Thought Content: Thought content normal.        Judgment: Judgment normal.  Vitals reviewed.     Assessment/Plan:  Encounter for annual routine gynecological examination  Encounter for screening mammogram for malignant neoplasm of breast - Plan: MM 3D SCREEN BREAST BILATERAL; pt to sched mammo          GYN counsel breast self exam, mammography screening,  adequate intake of calcium and vitamin D, diet and exercise    F/U  Return in about 1 year (around 11/19/2020).  Thomasenia Dowse B. Ryden Wainer, PA-C 11/20/2019 8:52 AM

## 2019-11-19 NOTE — Patient Instructions (Signed)
I value your feedback and entrusting us with your care. If you get a [facility] patient survey, I would appreciate you taking the time to let us know about your experience today. Thank you!  As of October 09, 2019, your lab results will be released to your MyChart immediately, before I even have a chance to see them. Please give me time to review them and contact you if there are any abnormalities. Thank you for your patience.  

## 2019-11-20 ENCOUNTER — Ambulatory Visit (INDEPENDENT_AMBULATORY_CARE_PROVIDER_SITE_OTHER): Payer: PRIVATE HEALTH INSURANCE | Admitting: Obstetrics and Gynecology

## 2019-11-20 ENCOUNTER — Other Ambulatory Visit: Payer: Self-pay

## 2019-11-20 ENCOUNTER — Encounter: Payer: Self-pay | Admitting: Obstetrics and Gynecology

## 2019-11-20 VITALS — BP 100/80 | Ht 64.0 in | Wt 183.0 lb

## 2019-11-20 DIAGNOSIS — Z01419 Encounter for gynecological examination (general) (routine) without abnormal findings: Secondary | ICD-10-CM

## 2019-11-20 DIAGNOSIS — Z1231 Encounter for screening mammogram for malignant neoplasm of breast: Secondary | ICD-10-CM

## 2019-12-30 ENCOUNTER — Ambulatory Visit
Admission: RE | Admit: 2019-12-30 | Discharge: 2019-12-30 | Disposition: A | Discharge status: Home / Self Care | Payer: PRIVATE HEALTH INSURANCE | Source: Ambulatory Visit | Attending: Obstetrics and Gynecology | Admitting: Obstetrics and Gynecology

## 2019-12-30 ENCOUNTER — Encounter: Payer: Self-pay | Admitting: Obstetrics and Gynecology

## 2019-12-30 DIAGNOSIS — Z1231 Encounter for screening mammogram for malignant neoplasm of breast: Secondary | ICD-10-CM | POA: Diagnosis present

## 2020-10-08 IMAGING — MG DIGITAL SCREENING BILAT W/ TOMO W/ CAD
8 series · 9 of 24 positions shown · non-contrast
Comparison: Previous exam(s).

CLINICAL DATA: Screening.

EXAM:
DIGITAL SCREENING BILATERAL MAMMOGRAM WITH TOMO AND CAD

[L MLO synth-2D]
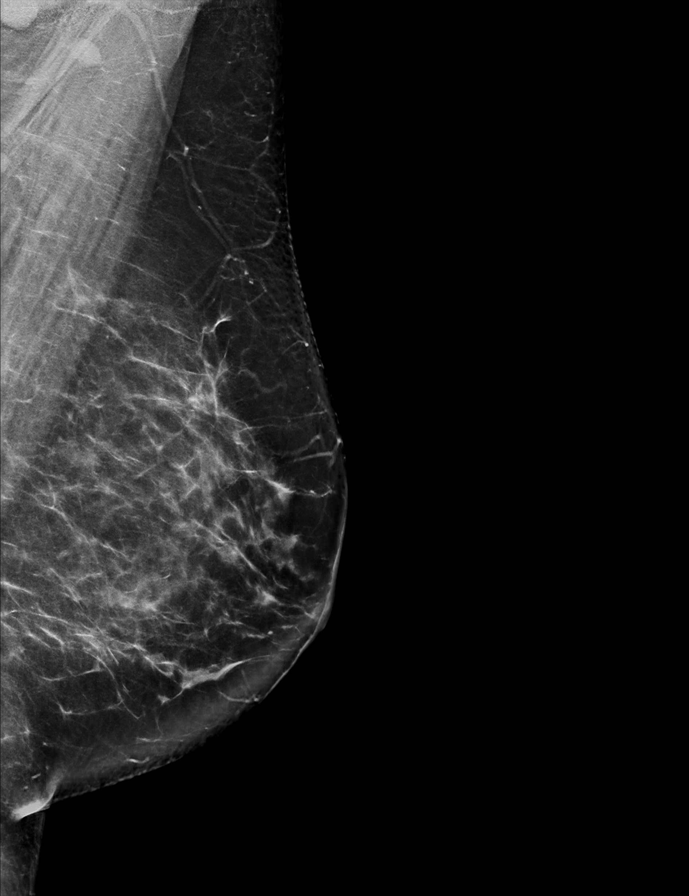

[L CC synth-2D]
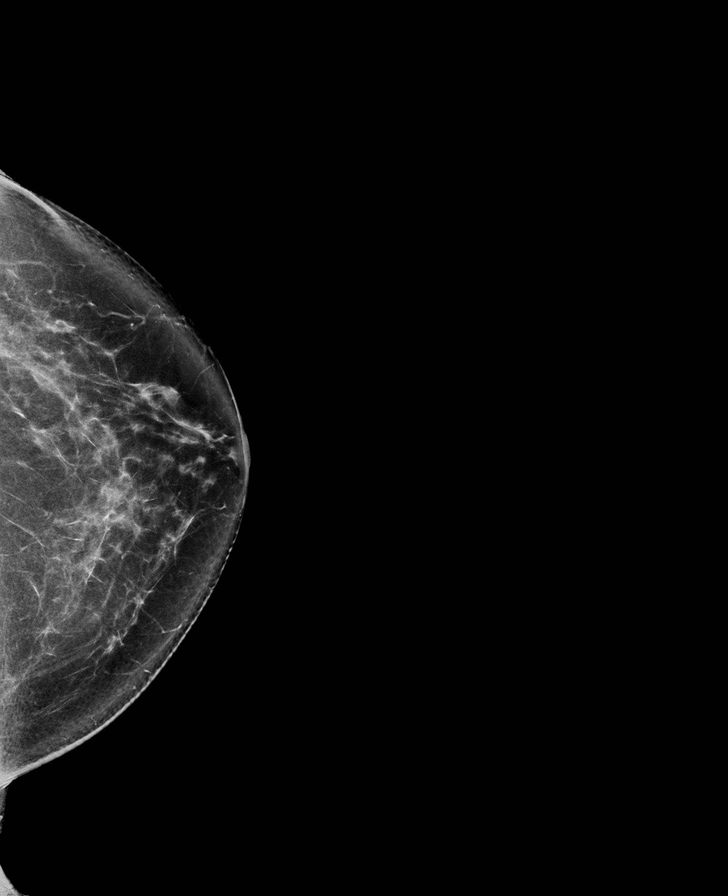

[R MLO synth-2D]
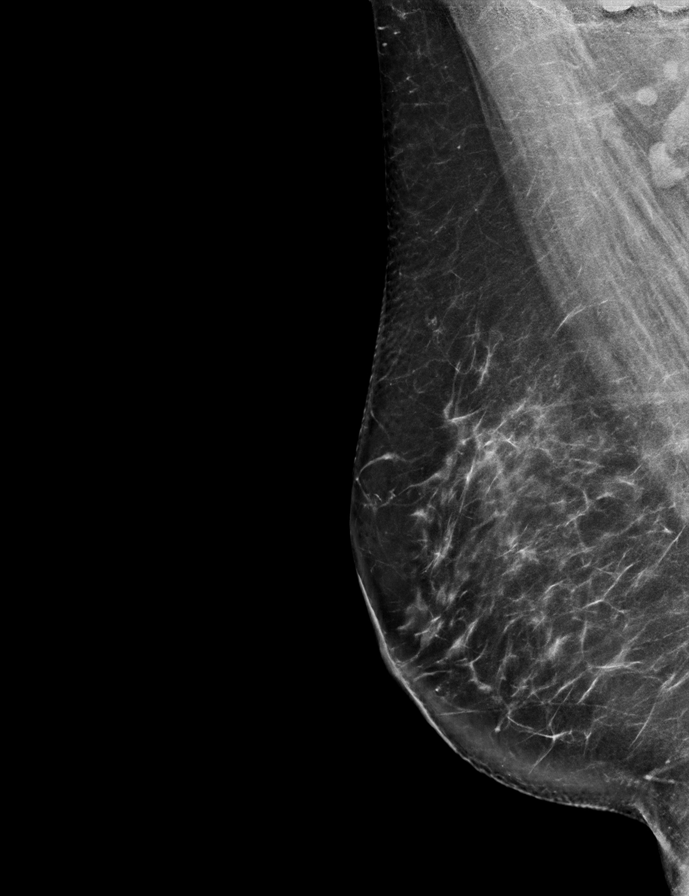

[R CC synth-2D]
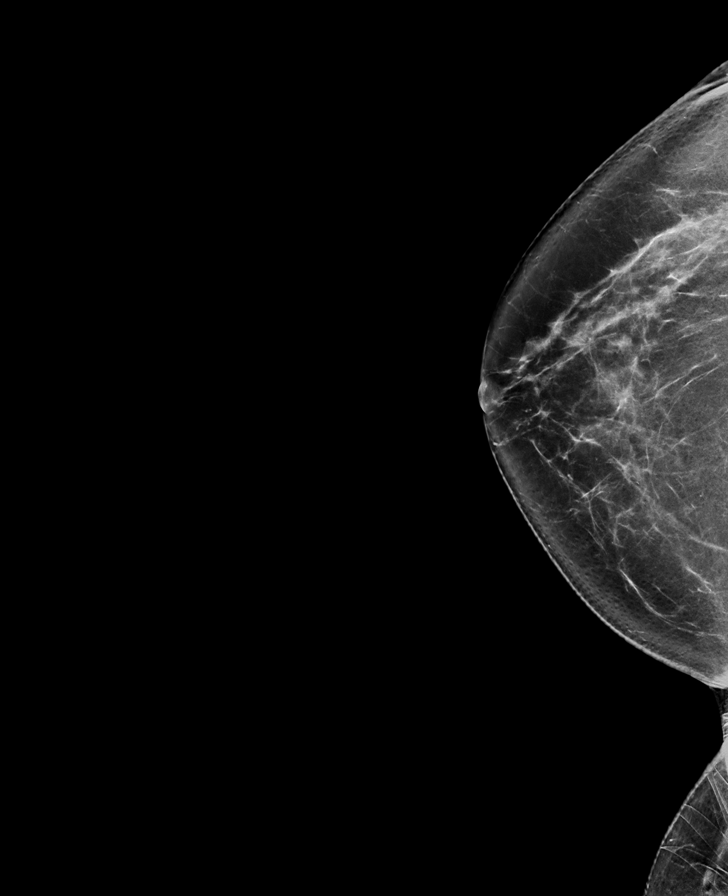

[L CC tomo · 2 of 87 frames shown]
[frame 29/87]
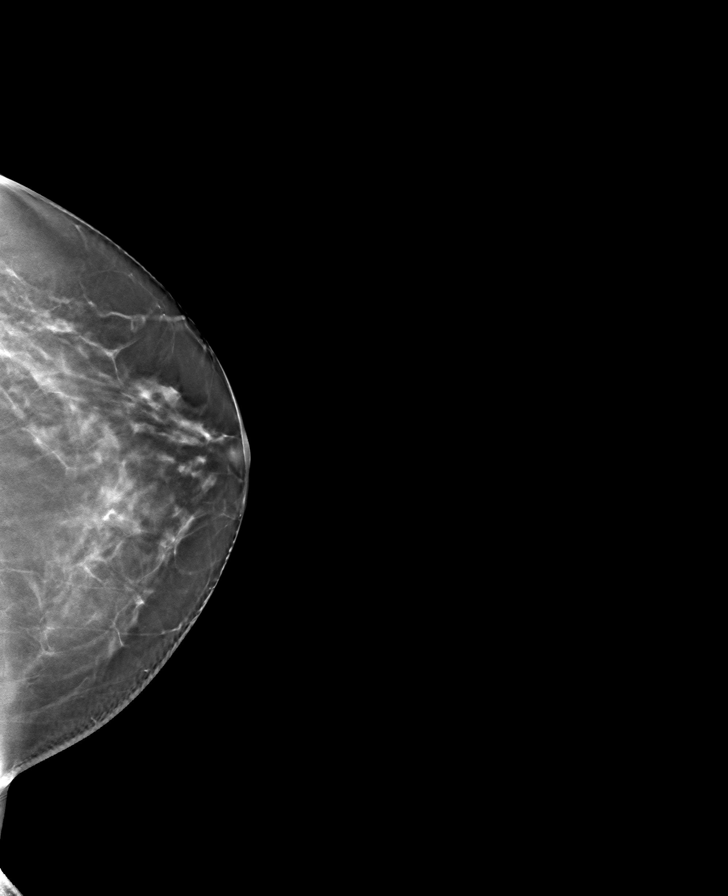
[frame 44/87]
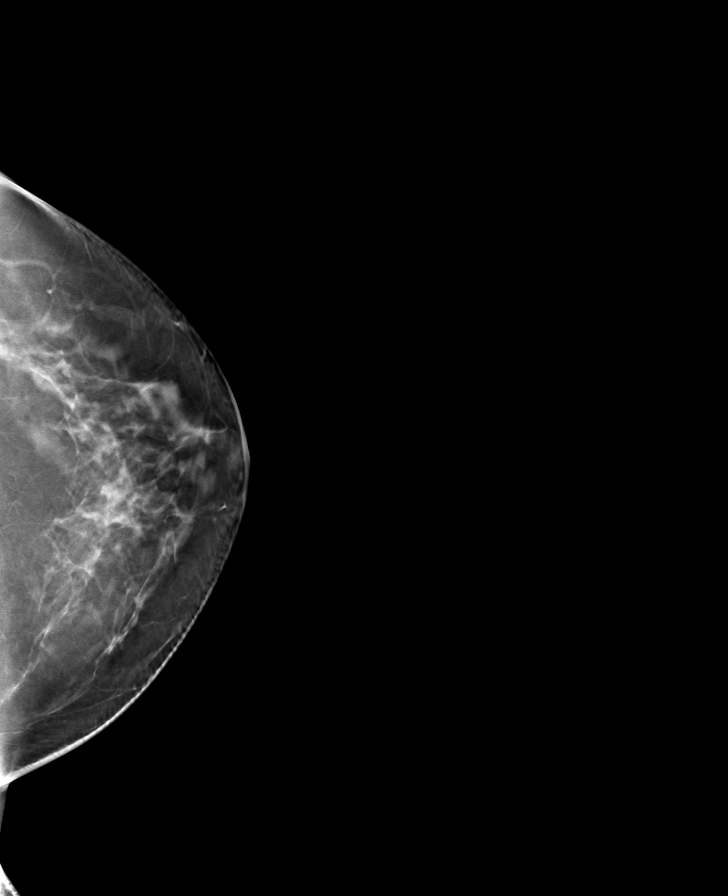

[R CC tomo · tomo slice 43/86.0]
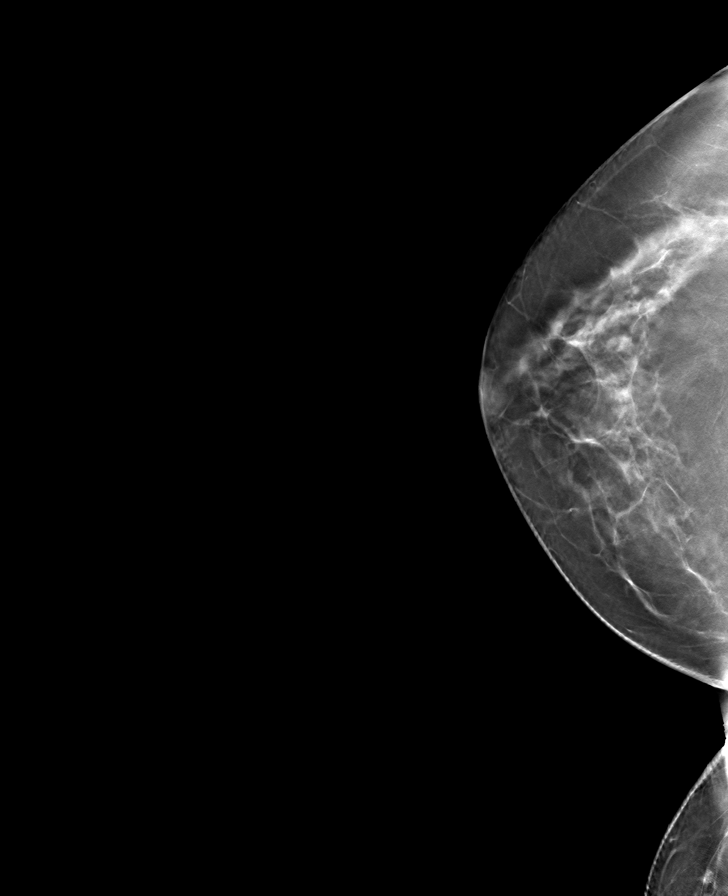

[L MLO tomo · tomo slice 43/85.0]
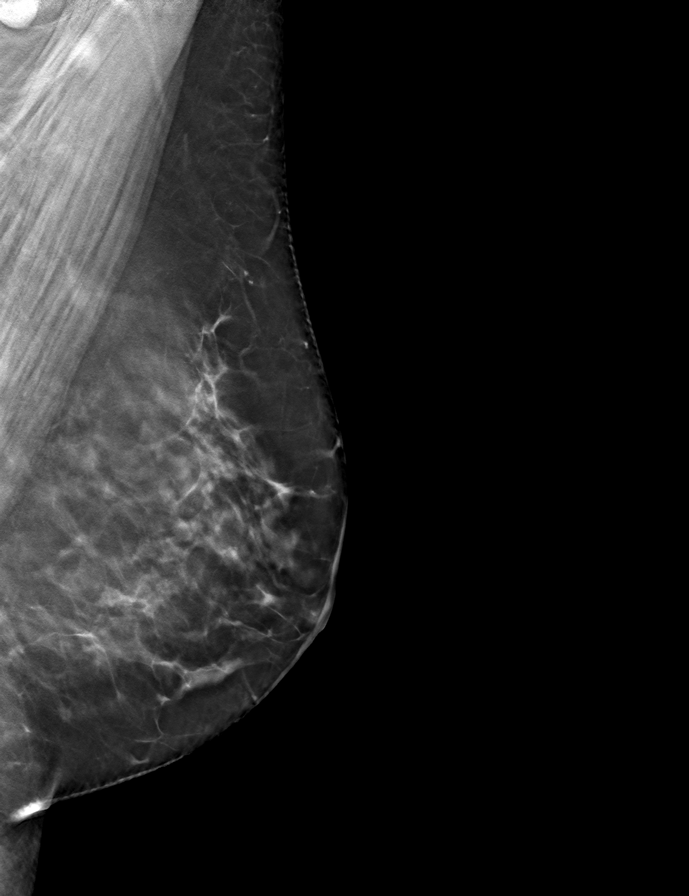

[R MLO tomo · tomo slice 43/84.0]
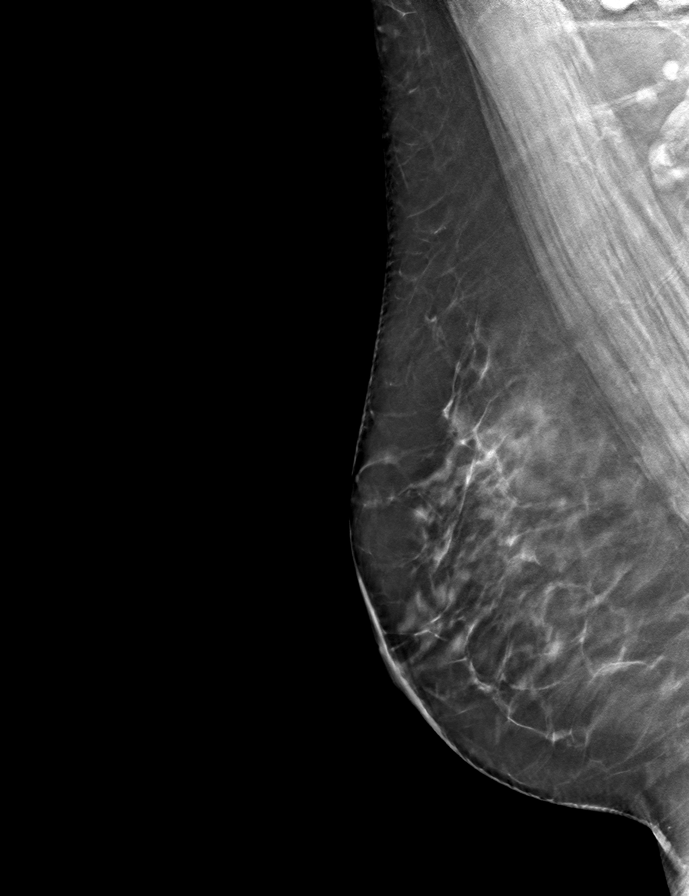

[9 of 24 positions shown; findings below may reference images not displayed]

ACR Breast Density Category b: There are scattered areas of
fibroglandular density.
FINDINGS: There are no findings suspicious for malignancy. Images were
processed with CAD.
IMPRESSION: No mammographic evidence of malignancy. A result letter of this
screening mammogram will be mailed directly to the patient.

RECOMMENDATION:
Screening mammogram in one year. (Code:CN-U-775)

BI-RADS CATEGORY  1: Negative.

## 2021-06-07 ENCOUNTER — Other Ambulatory Visit: Payer: Self-pay | Admitting: Endocrinology

## 2021-06-07 DIAGNOSIS — Z1231 Encounter for screening mammogram for malignant neoplasm of breast: Secondary | ICD-10-CM

## 2021-06-22 ENCOUNTER — Other Ambulatory Visit: Payer: Self-pay

## 2021-06-22 ENCOUNTER — Ambulatory Visit
Admission: RE | Admit: 2021-06-22 | Discharge: 2021-06-22 | Disposition: A | Discharge status: Home / Self Care | Payer: BC Managed Care – PPO | Source: Ambulatory Visit | Attending: Endocrinology | Admitting: Endocrinology

## 2021-06-22 DIAGNOSIS — Z1231 Encounter for screening mammogram for malignant neoplasm of breast: Secondary | ICD-10-CM | POA: Diagnosis not present

## 2021-07-10 NOTE — Progress Notes (Signed)
[place]P: [facility] [place]   Chief Complaint  Patient presents with   Gynecologic Exam    No concerns     HPI:      [name] is a 60 y.o. No obstetric history on file. who LMP was No LMP recorded. Patient has had a hysterectomy., presents today for her annual examination.  Her menses are absent due to menopause/TAHBSO with LOA in 1994. She does not have PMB.  She does have tolerable vasomotor sx. Was on estradiol but weaned off a couple yrs ago and doing well.  Sex activity: single partner, contraception - status post hysterectomy. She does have vaginal dryness and uses lubricants with some improvement.  Last Pap: 10/16/18 Results were: no abnormalities / 2018 neg HPV DNA. Pt wanted them in past due to insurance requirement.  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 There is no FH of ovarian cancer. The patient does do self-breast exams.   Colonoscopy: colonoscopy 2022 without abnormalities.  Repeat due after 10 years.  DEXA: osteoporosis; Q 6 mo injections; changed from yearly IV infusions since not working; with Dr. Patrecia Pace.  Tobacco use: The patient denies current or previous tobacco use. Alcohol use: occas No drug use Exercise: min active  She does get adequate calcium and Vitamin D in her diet.  Labs/meds with Dr. Patrecia Pace. Has a hx of cold sores, would like Rx RF valtrex.    Past Medical History:  Diagnosis Date   Atrophic vaginitis    Cold sore    Hypertension    Osteopenia 2014   Osteoporosis    Thyroid disease    Hypothyroidism    Past Surgical History:  Procedure Laterality Date   BACK SURGERY     CERVICAL BIOPSY  W/ LOOP ELECTRODE EXCISION     CHOLECYSTECTOMY     COLONOSCOPY  2012   COLPOSCOPY     TONSILLECTOMY AND ADENOIDECTOMY     TOTAL ABDOMINAL HYSTERECTOMY W/ BILATERAL SALPINGOOPHORECTOMY  1994   TAH BSO    Family History   Problem Relation Age of Onset   Heart disease Mother    Osteoporosis Mother    Cancer Father        lungs, malignant   Breast cancer Neg Hx     Social History   Socioeconomic History   Marital status: Married    Spouse name: Not on file   Number of children: Not on file   Years of education: Not on file   Highest education level: Not on file  Occupational History   Not on file  Tobacco Use   Smoking status: Never   Smokeless tobacco: Never  Vaping Use   Vaping Use: Never used  Substance and Sexual Activity   Alcohol use: Yes    Comment: occ   Drug use: No   Sexual activity: Yes    Birth control/protection: Surgical    Comment: Hysterectomy  Other Topics Concern   Not on file  Social History Narrative   Not on file   Social Determinants of Health   Financial Resource Strain: Not on file  Food Insecurity: Not on file  Transportation Needs: Not on file  Physical Activity: Not on file  Stress: Not on file  Social Connections: Not on file  Intimate Partner Violence: Not on file    Current Meds  Medication Sig   ezetimibe (ZETIA) 10 MG tablet  Take 10 mg by mouth daily.   furosemide (LASIX) 20 MG tablet Take 20 mg by mouth daily.   levothyroxine (SYNTHROID) 75 MCG tablet TAKE 1 BY MOUTH EVERY MORNING   simvastatin (ZOCOR) 10 MG tablet Take by mouth.   valACYclovir (VALTREX) 1000 MG tablet Take 2 tabs BID x 1 day prn sx   Vitamin D, Ergocalciferol, (DRISDOL) 1.25 MG (50000 UT) CAPS capsule Take by mouth.   Zoledronic Acid 4 MG SOLR Inject into the vein.      ROS:  Review of Systems  Constitutional:  Negative for fatigue, fever and unexpected weight change.  Respiratory:  Negative for cough, shortness of breath and wheezing.   Cardiovascular:  Negative for chest pain, palpitations and leg swelling.  Gastrointestinal:  Negative for blood in stool, constipation, diarrhea, nausea and vomiting.  Endocrine: Negative for cold intolerance, heat intolerance and  polyuria.  Genitourinary:  Negative for dyspareunia, dysuria, flank pain, frequency, genital sores, hematuria, menstrual problem, pelvic pain, urgency, vaginal bleeding, vaginal discharge and vaginal pain.  Musculoskeletal:  Negative for back pain, joint swelling and myalgias.  Skin:  Negative for rash.  Neurological:  Negative for dizziness, syncope, light-headedness, numbness and headaches.  Hematological:  Negative for adenopathy.  Psychiatric/Behavioral:  Negative for agitation, confusion, sleep disturbance and suicidal ideas. The patient is not nervous/anxious.    Objective: BP 110/70   Ht 5\' 4"  (1.626 m)   Wt 185 lb (83.9 kg)   BMI 31.76 kg/m    Physical Exam Constitutional:      Appearance: She is well-developed.  Genitourinary:     Vulva normal.     Genitourinary Comments: UTERUS/CX SURG REM     Right Labia: No rash, tenderness or lesions.    Left Labia: No tenderness, lesions or rash.    Vaginal cuff intact.    No vaginal discharge, erythema or tenderness.      Right Adnexa: absent.    Right Adnexa: not tender and no mass present.    Left Adnexa: absent.    Left Adnexa: not tender and no mass present.    Cervix is absent.     Uterus is absent.  Breasts:    Right: No mass, nipple discharge, skin change or tenderness.     Left: No mass, nipple discharge, skin change or tenderness.  Neck:     Thyroid: No thyromegaly.  Cardiovascular:     Rate and Rhythm: Normal rate and regular rhythm.     Heart sounds: Normal heart sounds. No murmur heard. Pulmonary:     Effort: Pulmonary effort is normal.     Breath sounds: Normal breath sounds.  Abdominal:     Palpations: Abdomen is soft.     Tenderness: There is no abdominal tenderness. There is no guarding.  Musculoskeletal:        General: Normal range of motion.     Cervical back: Normal range of motion.  Neurological:     General: No focal deficit present.     Mental Status: She is alert and oriented to person, place,  and time.     Cranial Nerves: No cranial nerve deficit.  Skin:    General: Skin is warm and dry.  Psychiatric:        Mood and Affect: Mood normal.        Behavior: Behavior normal.        Thought Content: Thought content normal.        Judgment: Judgment normal.  Vitals reviewed.  Assessment/Plan: Encounter for annual routine gynecological examination  Cervical cancer screening - Plan: Cytology - PAP  Screening for HPV (human papillomavirus) - Plan: Cytology - PAP  Encounter for screening mammogram for malignant neoplasm of breast; pt current on mammo  Cold sore - Plan: valACYclovir (VALTREX) 1000 MG tablet; Rx valtrex, f/u prn.   Meds ordered this encounter  Medications   valACYclovir (VALTREX) 1000 MG tablet    Sig: Take 2 tabs BID x 1 day prn sx    Dispense:  30 tablet    Refill:  1    Order Specific Question:   Supervising Provider    Answer:   Nadara Mustard [[id number]]     GYN counsel breast self exam, mammography screening,  adequate intake of calcium and vitamin D, diet and exercise    F/U  Return in about 1 year (around 07/11/2022).  Jermika Olden B. Marquavious Nazar, PA-C 07/11/2021 11:29 AM

## 2021-07-11 ENCOUNTER — Encounter: Payer: Self-pay | Admitting: Obstetrics and Gynecology

## 2021-07-11 ENCOUNTER — Other Ambulatory Visit (HOSPITAL_COMMUNITY)
Admission: RE | Admit: 2021-07-11 | Discharge: 2021-07-11 | Disposition: A | Discharge status: Home / Self Care | Payer: BC Managed Care – PPO | Source: Ambulatory Visit | Attending: Obstetrics and Gynecology | Admitting: Obstetrics and Gynecology

## 2021-07-11 ENCOUNTER — Other Ambulatory Visit: Payer: Self-pay

## 2021-07-11 ENCOUNTER — Ambulatory Visit (INDEPENDENT_AMBULATORY_CARE_PROVIDER_SITE_OTHER): Payer: BC Managed Care – PPO | Admitting: Obstetrics and Gynecology

## 2021-07-11 VITALS — BP 110/70 | Ht 64.0 in | Wt 185.0 lb

## 2021-07-11 DIAGNOSIS — B001 Herpesviral vesicular dermatitis: Secondary | ICD-10-CM | POA: Insufficient documentation

## 2021-07-11 DIAGNOSIS — Z124 Encounter for screening for malignant neoplasm of cervix: Secondary | ICD-10-CM

## 2021-07-11 DIAGNOSIS — Z01419 Encounter for gynecological examination (general) (routine) without abnormal findings: Secondary | ICD-10-CM | POA: Diagnosis not present

## 2021-07-11 DIAGNOSIS — Z1151 Encounter for screening for human papillomavirus (HPV): Secondary | ICD-10-CM

## 2021-07-11 DIAGNOSIS — Z1211 Encounter for screening for malignant neoplasm of colon: Secondary | ICD-10-CM

## 2021-07-11 DIAGNOSIS — Z1231 Encounter for screening mammogram for malignant neoplasm of breast: Secondary | ICD-10-CM | POA: Diagnosis not present

## 2021-07-11 MED ORDER — VALACYCLOVIR HCL 1 G PO TABS: ORAL_TABLET | ORAL | 1 refills | Status: DC

## 2021-07-11 NOTE — Patient Instructions (Signed)
I value your feedback and you entrusting us with your care. If you get a [facility] patient survey, I would appreciate you taking the time to let us know about your experience today. Thank you! ? ? ?

## 2021-07-14 ENCOUNTER — Other Ambulatory Visit: Payer: Self-pay | Admitting: Obstetrics and Gynecology

## 2021-07-14 DIAGNOSIS — B001 Herpesviral vesicular dermatitis: Secondary | ICD-10-CM

## 2021-07-15 LAB — CYTOLOGY - PAP
Comment: NEGATIVE
Diagnosis: NEGATIVE
High risk HPV: NEGATIVE

## 2022-05-09 ENCOUNTER — Other Ambulatory Visit: Payer: Self-pay | Admitting: Obstetrics and Gynecology

## 2022-05-09 ENCOUNTER — Other Ambulatory Visit: Payer: Self-pay | Admitting: Endocrinology

## 2022-05-09 DIAGNOSIS — Z1231 Encounter for screening mammogram for malignant neoplasm of breast: Secondary | ICD-10-CM

## 2022-06-27 ENCOUNTER — Ambulatory Visit
Admission: RE | Admit: 2022-06-27 | Discharge: 2022-06-27 | Disposition: A | Discharge status: Home / Self Care | Payer: BC Managed Care – PPO | Source: Ambulatory Visit | Attending: Obstetrics and Gynecology | Admitting: Obstetrics and Gynecology

## 2022-06-27 DIAGNOSIS — Z1231 Encounter for screening mammogram for malignant neoplasm of breast: Secondary | ICD-10-CM | POA: Diagnosis present

## 2022-07-17 NOTE — Progress Notes (Unsigned)
PCP: Lynn   No chief complaint on file.    HPI:      [name] is a 61 y.o. No obstetric history on file. who LMP was No LMP recorded. Patient has had a hysterectomy., presents today for her annual examination.  Her menses are absent due to menopause/TAHBSO with LOA in 1994. She does not have PMB.  She does have tolerable vasomotor sx. Was on estradiol but weaned off a couple yrs ago and doing well.  Sex activity: single partner, contraception - status post hysterectomy. She does have vaginal dryness and uses lubricants with some improvement.  Last Pap: 07/11/21  Results were: no abnormalities /neg HPV DNA.  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 There is no FH of ovarian cancer. The patient does do self-breast exams.   Colonoscopy: colonoscopy 2022 without abnormalities.  Repeat due after 10 years.  DEXA: osteoporosis; Q 6 mo injections; changed from yearly IV infusions since not working; with Dr. Ronnald Collum.  Tobacco use: The patient denies current or previous tobacco use. Alcohol use: occas No drug use Exercise: min active  She does get adequate calcium and Vitamin D in her diet.  Labs/meds with Dr. Ronnald Collum. Has a hx of cold sores, would like Rx RF valtrex.    Past Medical History:  Diagnosis Date   Atrophic vaginitis    Cold sore    Hypertension    Osteopenia 2014   Osteoporosis    Thyroid disease    Hypothyroidism    Past Surgical History:  Procedure Laterality Date   BACK SURGERY     CERVICAL BIOPSY  W/ LOOP ELECTRODE EXCISION     CHOLECYSTECTOMY     COLONOSCOPY  2012   COLPOSCOPY     TONSILLECTOMY AND ADENOIDECTOMY     TOTAL ABDOMINAL HYSTERECTOMY W/ BILATERAL SALPINGOOPHORECTOMY  1994   TAH BSO    Family History  Problem Relation Age of Onset   Heart disease Mother    Osteoporosis Mother    Cancer Father         lungs, malignant   Breast cancer Neg Hx     Social History   Socioeconomic History   Marital status: Married    Spouse name: Not on file   Number of children: Not on file   Years of education: Not on file   Highest education level: Not on file  Occupational History   Not on file  Tobacco Use   Smoking status: Never   Smokeless tobacco: Never  Vaping Use   Vaping Use: Never used  Substance and Sexual Activity   Alcohol use: Yes    Comment: occ   Drug use: No   Sexual activity: Yes    Birth control/protection: Surgical    Comment: Hysterectomy  Other Topics Concern   Not on file  Social History Narrative   Not on file   Social Determinants of Health   Financial Resource Strain: Not on file  Food Insecurity: Not on file  Transportation Needs: Not on file  Physical Activity: Sufficiently Active (10/15/2017)   Exercise Vital Sign    Days of Exercise per Week: 4 days    Minutes of Exercise per Session: 50 min  Stress: No Stress Concern Present (10/15/2017)   [employer] [place] of Stress : Not at all  Social Connections: Socially Integrated (10/15/2017)   Social Connection and Isolation Panel [NHANES]    Frequency of Communication with Friends and Family: More than three times a week    Frequency of Social Gatherings with Friends and Family: More than three times a week    Attends Religious Services: More than 4 times per year    Active Member of [employer] or Organizations: Yes    Attends Engineer, structural: More than 4 times per year    Marital Status: Married  Catering manager Violence: Not At Risk (10/15/2017)   Humiliation, Afraid, Rape, and Kick questionnaire    Fear of Current or Ex-Partner: No    Emotionally Abused: No    Physically Abused: No    Sexually Abused: No    No outpatient medications have been marked as taking for the 07/18/22 encounter (Appointment) with Haylea Schlichting, Ilona Sorrel,  PA-C.      ROS:  Review of Systems  Constitutional:  Negative for fatigue, fever and unexpected weight change.  Respiratory:  Negative for cough, shortness of breath and wheezing.   Cardiovascular:  Negative for chest pain, palpitations and leg swelling.  Gastrointestinal:  Negative for blood in stool, constipation, diarrhea, nausea and vomiting.  Endocrine: Negative for cold intolerance, heat intolerance and polyuria.  Genitourinary:  Negative for dyspareunia, dysuria, flank pain, frequency, genital sores, hematuria, menstrual problem, pelvic pain, urgency, vaginal bleeding, vaginal discharge and vaginal pain.  Musculoskeletal:  Negative for back pain, joint swelling and myalgias.  Skin:  Negative for rash.  Neurological:  Negative for dizziness, syncope, light-headedness, numbness and headaches.  Hematological:  Negative for adenopathy.  Psychiatric/Behavioral:  Negative for agitation, confusion, sleep disturbance and suicidal ideas. The patient is not nervous/anxious.     Objective: There were no vitals taken for this visit.   Physical Exam Constitutional:      Appearance: She is well-developed.  Genitourinary:     Vulva normal.     Genitourinary Comments: UTERUS/CX SURG REM     Right Labia: No rash, tenderness or lesions.    Left Labia: No tenderness, lesions or rash.    Vaginal cuff intact.    No vaginal discharge, erythema or tenderness.      Right Adnexa: absent.    Right Adnexa: not tender and no mass present.    Left Adnexa: absent.    Left Adnexa: not tender and no mass present.    Cervix is absent.     Uterus is absent.  Breasts:    Right: No mass, nipple discharge, skin change or tenderness.     Left: No mass, nipple discharge, skin change or tenderness.  Neck:     Thyroid: No thyromegaly.  Cardiovascular:     Rate and Rhythm: Normal rate and regular rhythm.     Heart sounds: Normal heart sounds. No murmur heard. Pulmonary:     Effort: Pulmonary effort  is normal.     Breath sounds: Normal breath sounds.  Abdominal:     Palpations: Abdomen is soft.     Tenderness: There is no abdominal tenderness. There is no guarding.  Musculoskeletal:        General: Normal range of motion.     Cervical back: Normal range of motion.  Neurological:     General: No focal deficit present.     Mental Status: She is alert and oriented to person, place, and time.     Cranial Nerves: No cranial nerve deficit.  Skin:    General: Skin is  warm and dry.  Psychiatric:        Mood and Affect: Mood normal.        Behavior: Behavior normal.        Thought Content: Thought content normal.        Judgment: Judgment normal.  Vitals reviewed.     Assessment/Plan: Encounter for annual routine gynecological examination  Cervical cancer screening - Plan: Cytology - PAP  Screening for HPV (human papillomavirus) - Plan: Cytology - PAP  Encounter for screening mammogram for malignant neoplasm of breast; pt current on mammo  Cold sore - Plan: valACYclovir (VALTREX) 1000 MG tablet; Rx valtrex, f/u prn.   No orders of the defined types were placed in this encounter.    GYN counsel breast self exam, mammography screening,  adequate intake of calcium and vitamin D, diet and exercise    F/U  No follow-ups on file.  Riane Rung B. Lourine Alberico, PA-C 07/17/2022 2:06 PM

## 2022-07-18 ENCOUNTER — Ambulatory Visit (INDEPENDENT_AMBULATORY_CARE_PROVIDER_SITE_OTHER): Payer: BC Managed Care – PPO | Admitting: Obstetrics and Gynecology

## 2022-07-18 ENCOUNTER — Encounter: Payer: Self-pay | Admitting: Obstetrics and Gynecology

## 2022-07-18 VITALS — BP 100/60 | Ht 63.5 in | Wt 180.0 lb

## 2022-07-18 DIAGNOSIS — Z01419 Encounter for gynecological examination (general) (routine) without abnormal findings: Secondary | ICD-10-CM

## 2022-07-18 DIAGNOSIS — B001 Herpesviral vesicular dermatitis: Secondary | ICD-10-CM | POA: Diagnosis not present

## 2022-07-18 DIAGNOSIS — N951 Menopausal and female climacteric states: Secondary | ICD-10-CM | POA: Diagnosis not present

## 2022-07-18 DIAGNOSIS — Z1231 Encounter for screening mammogram for malignant neoplasm of breast: Secondary | ICD-10-CM | POA: Diagnosis not present

## 2022-07-18 NOTE — Patient Instructions (Signed)
I value your feedback and you entrusting us with your care. If you get a [place] patient survey, I would appreciate you taking the time to let us know about your experience today. Thank you! ? ? ?

## 2022-07-26 ENCOUNTER — Ambulatory Visit: Payer: BC Managed Care – PPO | Admitting: Physician Assistant

## 2022-07-26 DIAGNOSIS — Z23 Encounter for immunization: Secondary | ICD-10-CM

## 2023-05-22 ENCOUNTER — Other Ambulatory Visit: Payer: Self-pay | Admitting: Obstetrics and Gynecology

## 2023-05-22 ENCOUNTER — Encounter: Payer: Self-pay | Admitting: Obstetrics and Gynecology

## 2023-05-22 DIAGNOSIS — Z1231 Encounter for screening mammogram for malignant neoplasm of breast: Secondary | ICD-10-CM

## 2023-07-03 ENCOUNTER — Ambulatory Visit
Admission: RE | Admit: 2023-07-03 | Discharge: 2023-07-03 | Disposition: A | Discharge status: Home / Self Care | Payer: BC Managed Care – PPO | Source: Ambulatory Visit | Attending: Obstetrics and Gynecology | Admitting: Obstetrics and Gynecology

## 2023-07-03 DIAGNOSIS — Z1231 Encounter for screening mammogram for malignant neoplasm of breast: Secondary | ICD-10-CM | POA: Diagnosis present

## 2023-07-18 ENCOUNTER — Ambulatory Visit: Payer: BC Managed Care – PPO

## 2023-08-13 ENCOUNTER — Other Ambulatory Visit (INDEPENDENT_AMBULATORY_CARE_PROVIDER_SITE_OTHER): Payer: Self-pay | Admitting: Nurse Practitioner

## 2023-08-13 DIAGNOSIS — R6889 Other general symptoms and signs: Secondary | ICD-10-CM

## 2023-08-20 ENCOUNTER — Ambulatory Visit (INDEPENDENT_AMBULATORY_CARE_PROVIDER_SITE_OTHER): Payer: BC Managed Care – PPO

## 2023-08-20 ENCOUNTER — Encounter (INDEPENDENT_AMBULATORY_CARE_PROVIDER_SITE_OTHER): Payer: Self-pay | Admitting: Vascular Surgery

## 2023-08-20 ENCOUNTER — Ambulatory Visit (INDEPENDENT_AMBULATORY_CARE_PROVIDER_SITE_OTHER): Payer: BC Managed Care – PPO | Admitting: Vascular Surgery

## 2023-08-20 VITALS — BP 168/78 | HR 75 | Resp 18 | Ht 64.0 in | Wt 160.6 lb

## 2023-08-20 DIAGNOSIS — R6889 Other general symptoms and signs: Secondary | ICD-10-CM

## 2023-08-20 DIAGNOSIS — M79606 Pain in leg, unspecified: Secondary | ICD-10-CM

## 2023-08-20 NOTE — Progress Notes (Signed)
MRN : [id number]  Diane Gross is a 62 y.o. ([birth year]) female who presents with chief complaint of check circulation.  History of Present Illness:   The patient is referred by Dr. Dario Guardian based on a history of an abnormal ABI.  At the time of this she is not complaining of claudication-like symptoms.  She does have some tenderness to palpation of the right shin which has been present for many many years.  This is likely related to a back injury that she sustained in the past.  There is been no history of rest pain type symptoms no open wounds or sores.  ABIs obtained today are normal bilaterally with triphasic signals throughout and normal TBI's as well  No outpatient medications have been marked as taking for the 08/20/23 encounter (Appointment) with Gilda Crease, Latina Craver, MD.    Past Medical History:  Diagnosis Date   Atrophic vaginitis    Cold sore    Hypertension    Osteopenia 2014   Osteoporosis    Thyroid disease    Hypothyroidism    Past Surgical History:  Procedure Laterality Date   BACK SURGERY     CERVICAL BIOPSY  W/ LOOP ELECTRODE EXCISION     CHOLECYSTECTOMY     COLONOSCOPY  2012   COLPOSCOPY     TONSILLECTOMY AND ADENOIDECTOMY     TOTAL ABDOMINAL HYSTERECTOMY W/ BILATERAL SALPINGOOPHORECTOMY  1994   TAH BSO    Social History Social History   Tobacco Use   Smoking status: Never   Smokeless tobacco: Never  Vaping Use   Vaping status: Never Used  Substance Use Topics   Alcohol use: Yes    Comment: occ   Drug use: No    Family History Family History  Problem Relation Age of Onset   Heart disease Mother    Osteoporosis Mother    Cancer Father        lungs, malignant   Breast cancer Neg Hx     No Known Allergies   REVIEW OF SYSTEMS (Negative unless checked)  Constitutional: [] Weight loss  [] Fever  [] Chills Cardiac: [] Chest pain   [] Chest pressure   [] Palpitations    [] Shortness of breath when laying flat   [] Shortness of breath with exertion. Vascular:  [x] Pain in legs with walking   [] Pain in legs at rest  [] History of DVT   [] Phlebitis   [] Swelling in legs   [] Varicose veins   [] Non-healing ulcers Pulmonary:   [] Uses home oxygen   [] Productive cough   [] Hemoptysis   [] Wheeze  [] COPD   [] Asthma Neurologic:  [] Dizziness   [] Seizures   [] History of stroke   [] History of TIA  [] Aphasia   [] Vissual changes   [] Weakness or numbness in arm   [] Weakness or numbness in leg Musculoskeletal:   [] Joint swelling   [] Joint pain   [] Low back pain Hematologic:  [] Easy bruising  [] Easy bleeding   [] Hypercoagulable state   [] Anemic Gastrointestinal:  [] Diarrhea   [] Vomiting  [] Gastroesophageal reflux/heartburn   [] Difficulty swallowing. Genitourinary:  [] Chronic kidney disease   [] Difficult urination  [] Frequent urination   []   Blood in urine Skin:  [] Rashes   [] Ulcers  Psychological:  [] History of anxiety   []  History of major depression.  Physical Examination  There were no vitals filed for this visit. There is no height or weight on file to calculate BMI. Gen: WD/WN, NAD Head: [place]/AT, No temporalis wasting.  Ear/Nose/Throat: Hearing grossly intact, nares w/o erythema or drainage Eyes: PER, EOMI, sclera nonicteric.  Neck: Supple, no masses.  No bruit or JVD.  Pulmonary:  Good air movement, no audible wheezing, no use of accessory muscles.  Cardiac: RRR, normal S1, S2, no Murmurs. Vascular:  mild trophic changes, no open wounds Vessel Right Left  Radial Palpable Palpable  PT Palpable Palpable  DP Palpable  Palpable  Gastrointestinal: soft, non-distended. No guarding/no peritoneal signs.  Musculoskeletal: M/S 5/5 throughout.  No visible deformity.  Neurologic: CN 2-12 intact. Pain and light touch intact in extremities.  Symmetrical.  Speech is fluent. Motor exam as listed above. Psychiatric: Judgment intact, Mood & affect appropriate for pt's clinical  situation. Dermatologic: No rashes or ulcers noted.  No changes consistent with cellulitis.   CBC Lab Results  Component Value Date   WBC 10.0 07/08/2014   HGB 10.6 (L) 07/08/2014   HCT 31.4 (L) 07/08/2014   MCV 86 07/08/2014   PLT 260 07/08/2014    BMET    Component Value Date/Time   NA 141 07/08/2014 0435   K 3.6 07/08/2014 0435   CL 106 07/08/2014 0435   CO2 28 07/08/2014 0435   GLUCOSE 81 07/08/2014 0435   BUN 11 07/08/2014 0435   CREATININE 0.90 07/08/2014 0435   CALCIUM 8.1 (L) 07/08/2014 0435   GFRNONAA >60 07/08/2014 0435   GFRAA >60 07/08/2014 0435   CrCl cannot be calculated (Patient's most recent lab result is older than the maximum 21 days allowed.).  COAG No results found for: "INR", "PROTIME"  Radiology No results found.   Assessment/Plan 1. Pain of lower extremity, unspecified laterality Recommend:  I do not find evidence of significant vascular disease. The patient specifically denies life style limitation.  She does have some dyskinesia of the shin area right lower extremity but I believe this is related to prior back trauma  Noninvasive studies including ABI's of the legs do not identify critical vascular problems.  They are normal with triphasic signals and normal TBI's as well  The patient should continue walking and begin a more formal exercise program. The patient should continue his antiplatelet therapy and aggressive treatment of the lipid abnormalities.  The patient is instructed to call the office if there is a significant change in the lower extremity symptoms, particularly if a wound develops or there is an abrupt increase in leg pain.  Patient will follow-up with me on a PRN basis    Levora Dredge, MD  08/20/2023 9:00 AM

## 2023-08-27 LAB — VAS US ABI WITH/WO TBI
Left ABI: 1.12
Right ABI: 1.2

## 2023-09-19 ENCOUNTER — Telehealth (INDEPENDENT_AMBULATORY_CARE_PROVIDER_SITE_OTHER): Payer: Self-pay

## 2024-03-18 ENCOUNTER — Encounter (INDEPENDENT_AMBULATORY_CARE_PROVIDER_SITE_OTHER): Payer: Self-pay

## 2024-03-28 ENCOUNTER — Other Ambulatory Visit: Payer: Self-pay | Admitting: Internal Medicine

## 2024-03-28 DIAGNOSIS — M899 Disorder of bone, unspecified: Secondary | ICD-10-CM

## 2024-03-28 DIAGNOSIS — M816 Localized osteoporosis [Lequesne]: Secondary | ICD-10-CM

## 2024-04-01 ENCOUNTER — Ambulatory Visit: Payer: Self-pay

## 2024-04-01 DIAGNOSIS — M899 Disorder of bone, unspecified: Secondary | ICD-10-CM

## 2024-04-01 DIAGNOSIS — M816 Localized osteoporosis [Lequesne]: Secondary | ICD-10-CM | POA: Diagnosis not present

## 2024-07-22 ENCOUNTER — Other Ambulatory Visit: Payer: Self-pay | Admitting: Obstetrics and Gynecology

## 2024-07-22 DIAGNOSIS — Z1231 Encounter for screening mammogram for malignant neoplasm of breast: Secondary | ICD-10-CM

## 2024-07-22 NOTE — Progress Notes (Unsigned)
 PCP: [facility]   No chief complaint on file.    HPI:      Diane Gross is a 63 y.o. No obstetric history on file. who LMP was No LMP recorded. Patient has had a hysterectomy., presents today for her annual examination.  Her menses are absent due to menopause/TAHBSO with LOA in 1994. She does not have PMB.  She does have occas vasomotor sx. Was on estradiol  but weaned off a couple yrs ago and doing well.  Sex activity: single partner, contraception - status post hysterectomy. She does have vaginal dryness and uses lubricants with some improvement. Hasn't tried coconut oil.   Last Pap: 07/11/21  Results were: no abnormalities /neg HPV DNA.  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 There is no FH of ovarian cancer. The patient does do self-breast exams.   Colonoscopy: colonoscopy 2022 without abnormalities.  Repeat due after 10 years.  DEXA: osteoporosis; Q 6 mo injections; changed from yearly IV infusions since not working; with Dr. Christi.  Tobacco use: The patient denies current or previous tobacco use. Alcohol use: occas No drug use Exercise: min active  She does get adequate calcium and Vitamin D in her diet.  Labs/meds with Dr. Christi. Has a hx of cold sores, doesn't need RF valtrex  today.    Past Medical History:  Diagnosis Date   Atrophic vaginitis    Cold sore    Hypertension    Osteopenia 2014   Osteoporosis    Thyroid disease    Hypothyroidism    Past Surgical History:  Procedure Laterality Date   BACK SURGERY     CERVICAL BIOPSY  W/ LOOP ELECTRODE EXCISION     CHOLECYSTECTOMY     COLONOSCOPY  2012   COLPOSCOPY     TONSILLECTOMY AND ADENOIDECTOMY     TOTAL ABDOMINAL HYSTERECTOMY W/ BILATERAL SALPINGOOPHORECTOMY  1994   TAH BSO    Family History  Problem Relation Age of Onset   Heart disease Mother     Osteoporosis Mother    Cancer Father        lungs, malignant   Breast cancer Neg Hx     Social History   Socioeconomic History   Marital status: Married    Spouse name: Not on file   Number of children: Not on file   Years of education: Not on file   Highest education level: Not on file  Occupational History   Not on file  Tobacco Use   Smoking status: Never   Smokeless tobacco: Never  Vaping Use   Vaping status: Never Used  Substance and Sexual Activity   Alcohol use: Yes    Comment: occ   Drug use: No   Sexual activity: Yes    Birth control/protection: Surgical    Comment: Hysterectomy  Other Topics Concern   Not on file  Social History Narrative   Not on file   Social Drivers of Health   Financial Resource Strain: Low Risk  (06/25/2024)   Received from [facility]   Overall Financial Resource Strain (CARDIA)    Difficulty of Paying Living Expenses: Not hard at all  Food Insecurity: No Food Insecurity (06/25/2024)   Received from [facility]   Hunger Vital Sign    Within the past 12 months, you worried that your food would run  out before you got the money to buy more.: Never true    Within the past 12 months, the food you bought just didn't last and you didn't have money to get more.: Never true  Transportation Needs: No Transportation Needs (06/25/2024)   Received from [facility] - Transportation    In the past 12 months, has lack of transportation kept you from medical appointments or from getting medications?: No    Lack of Transportation (Non-Medical): No  Physical Activity: Insufficiently Active (06/25/2024)   Received from [facility]   Exercise Vital Sign    On average, how many days per week do you engage in moderate to strenuous exercise (like a brisk walk)?: 3 days    On average, how many minutes do you engage in exercise at this level?: 30 min  Stress: No Stress Concern  Present (10/15/2017)   [employer] of Occupational Health - Occupational Stress Questionnaire    Feeling of Stress : Not at all  Social Connections: Socially Integrated (10/15/2017)   Social Connection and Isolation Panel    Frequency of Communication with Friends and Family: More than three times a week    Frequency of Social Gatherings with Friends and Family: More than three times a week    Attends Religious Services: More than 4 times per year    Active Member of [employer] or Organizations: Yes    Attends Banker Meetings: More than 4 times per year    Marital Status: Married  Catering manager Violence: Not At Risk (10/15/2017)   Humiliation, Afraid, Rape, and Kick questionnaire    Fear of Current or Ex-Partner: No    Emotionally Abused: No    Physically Abused: No    Sexually Abused: No    No outpatient medications have been marked as taking for the 07/24/24 encounter (Appointment) with Tamirah George, Bernarda B, PA-C.      ROS:  Review of Systems  Constitutional:  Negative for fatigue, fever and unexpected weight change.  Respiratory:  Negative for cough, shortness of breath and wheezing.   Cardiovascular:  Negative for chest pain, palpitations and leg swelling.  Gastrointestinal:  Negative for blood in stool, constipation, diarrhea, nausea and vomiting.  Endocrine: Negative for cold intolerance, heat intolerance and polyuria.  Genitourinary:  Negative for dyspareunia, dysuria, flank pain, frequency, genital sores, hematuria, menstrual problem, pelvic pain, urgency, vaginal bleeding, vaginal discharge and vaginal pain.  Musculoskeletal:  Negative for back pain, joint swelling and myalgias.  Skin:  Negative for rash.  Neurological:  Negative for dizziness, syncope, light-headedness, numbness and headaches.  Hematological:  Negative for adenopathy.  Psychiatric/Behavioral:  Negative for agitation, confusion, sleep disturbance and suicidal ideas. The patient is not  nervous/anxious.     Objective: There were no vitals taken for this visit.   Physical Exam Constitutional:      Appearance: She is well-developed.  Genitourinary:     Vulva normal.     Genitourinary Comments: UTERUS/CX SURG REM     Right Labia: No rash, tenderness or lesions.    Left Labia: No tenderness, lesions or rash.    Vaginal cuff intact.    No vaginal discharge, erythema or tenderness.      Right Adnexa: not tender and no mass present.    Left Adnexa: not tender and no mass present.    Cervix is absent.     Uterus is absent.  Breasts:    Right: No mass, nipple discharge, skin  change or tenderness.     Left: No mass, nipple discharge, skin change or tenderness.  Neck:     Thyroid: No thyromegaly.  Cardiovascular:     Rate and Rhythm: Normal rate and regular rhythm.     Heart sounds: Normal heart sounds. No murmur heard. Pulmonary:     Effort: Pulmonary effort is normal.     Breath sounds: Normal breath sounds.  Abdominal:     Palpations: Abdomen is soft.     Tenderness: There is no abdominal tenderness. There is no guarding.  Musculoskeletal:        General: Normal range of motion.     Cervical back: Normal range of motion.  Neurological:     General: No focal deficit present.     Mental Status: She is alert and oriented to person, place, and time.     Cranial Nerves: No cranial nerve deficit.  Skin:    General: Skin is warm and dry.  Psychiatric:        Mood and Affect: Mood normal.        Behavior: Behavior normal.        Thought Content: Thought content normal.        Judgment: Judgment normal.  Vitals reviewed.     Assessment/Plan: Encounter for annual routine gynecological examination  Encounter for screening mammogram for malignant neoplasm of breast; pt current on mammo  Vaginal dryness, menopausal--try coconut oil as lubricant. Can do vag ERT if doesn't improve sx. F/u prn.   Cold sore--will call for Rx RF prn   GYN counsel breast self  exam, mammography screening,  adequate intake of calcium and vitamin D, diet and exercise    F/U  No follow-ups on file.  Rethel Sebek B. Maysen Sudol, PA-C 07/22/2024 12:36 PM

## 2024-07-24 ENCOUNTER — Ambulatory Visit (INDEPENDENT_AMBULATORY_CARE_PROVIDER_SITE_OTHER): Admitting: Obstetrics and Gynecology

## 2024-07-24 ENCOUNTER — Encounter: Payer: Self-pay | Admitting: Obstetrics and Gynecology

## 2024-07-24 ENCOUNTER — Other Ambulatory Visit (HOSPITAL_COMMUNITY)
Admission: RE | Admit: 2024-07-24 | Discharge: 2024-07-24 | Disposition: A | Discharge status: Home / Self Care | Source: Ambulatory Visit | Attending: Obstetrics and Gynecology | Admitting: Obstetrics and Gynecology

## 2024-07-24 VITALS — BP 153/69 | HR 73 | Ht 64.0 in | Wt 162.0 lb

## 2024-07-24 DIAGNOSIS — Z90712 Acquired absence of cervix with remaining uterus: Secondary | ICD-10-CM

## 2024-07-24 DIAGNOSIS — N941 Unspecified dyspareunia: Secondary | ICD-10-CM | POA: Diagnosis not present

## 2024-07-24 DIAGNOSIS — Z1151 Encounter for screening for human papillomavirus (HPV): Secondary | ICD-10-CM | POA: Insufficient documentation

## 2024-07-24 DIAGNOSIS — B001 Herpesviral vesicular dermatitis: Secondary | ICD-10-CM

## 2024-07-24 DIAGNOSIS — Z124 Encounter for screening for malignant neoplasm of cervix: Secondary | ICD-10-CM | POA: Diagnosis present

## 2024-07-24 DIAGNOSIS — Z9889 Other specified postprocedural states: Secondary | ICD-10-CM | POA: Insufficient documentation

## 2024-07-24 DIAGNOSIS — Z01419 Encounter for gynecological examination (general) (routine) without abnormal findings: Secondary | ICD-10-CM

## 2024-07-24 DIAGNOSIS — Z1231 Encounter for screening mammogram for malignant neoplasm of breast: Secondary | ICD-10-CM

## 2024-07-24 DIAGNOSIS — Z0000 Encounter for general adult medical examination without abnormal findings: Secondary | ICD-10-CM

## 2024-07-24 MED ORDER — ESTRADIOL 0.1 MG/GM VA CREA: TOPICAL_CREAM | VAGINAL | 1 refills | Status: AC

## 2024-07-24 MED ORDER — VALACYCLOVIR HCL 1 G PO TABS: ORAL_TABLET | ORAL | 1 refills | Status: AC

## 2024-07-24 NOTE — Patient Instructions (Signed)
 I value your feedback and you entrusting [place] with your care. If you get a [place] patient survey, I would appreciate you taking the time to let us know about your experience today. Thank you! ? ? ?

## 2024-07-29 LAB — CYTOLOGY - PAP
Adequacy: ABSENT
Comment: NEGATIVE
Diagnosis: REACTIVE
High risk HPV: NEGATIVE

## 2024-08-13 ENCOUNTER — Ambulatory Visit

## 2024-08-13 DIAGNOSIS — Z23 Encounter for immunization: Secondary | ICD-10-CM

## 2024-08-15 ENCOUNTER — Ambulatory Visit
Admission: RE | Admit: 2024-08-15 | Discharge: 2024-08-15 | Disposition: A | Discharge status: Home / Self Care | Source: Ambulatory Visit | Attending: Obstetrics and Gynecology | Admitting: Obstetrics and Gynecology

## 2024-08-15 DIAGNOSIS — Z1231 Encounter for screening mammogram for malignant neoplasm of breast: Secondary | ICD-10-CM | POA: Diagnosis present

## 2024-08-20 ENCOUNTER — Inpatient Hospital Stay: Attending: Oncology | Admitting: Oncology

## 2024-08-20 ENCOUNTER — Encounter: Payer: Self-pay | Admitting: Oncology

## 2024-08-20 ENCOUNTER — Inpatient Hospital Stay

## 2024-08-20 ENCOUNTER — Ambulatory Visit: Payer: Self-pay | Admitting: Obstetrics and Gynecology

## 2024-08-20 VITALS — BP 130/62 | HR 66 | Temp 97.1°F | Resp 18 | Wt 162.4 lb

## 2024-08-20 DIAGNOSIS — I10 Essential (primary) hypertension: Secondary | ICD-10-CM | POA: Insufficient documentation

## 2024-08-20 DIAGNOSIS — Z90722 Acquired absence of ovaries, bilateral: Secondary | ICD-10-CM | POA: Insufficient documentation

## 2024-08-20 DIAGNOSIS — Z8262 Family history of osteoporosis: Secondary | ICD-10-CM | POA: Diagnosis not present

## 2024-08-20 DIAGNOSIS — Z809 Family history of malignant neoplasm, unspecified: Secondary | ICD-10-CM | POA: Insufficient documentation

## 2024-08-20 DIAGNOSIS — R779 Abnormality of plasma protein, unspecified: Secondary | ICD-10-CM | POA: Diagnosis present

## 2024-08-20 DIAGNOSIS — Z79899 Other long term (current) drug therapy: Secondary | ICD-10-CM | POA: Diagnosis not present

## 2024-08-20 DIAGNOSIS — R7689 Other specified abnormal immunological findings in serum: Secondary | ICD-10-CM

## 2024-08-20 DIAGNOSIS — E039 Hypothyroidism, unspecified: Secondary | ICD-10-CM | POA: Diagnosis not present

## 2024-08-20 DIAGNOSIS — Z90710 Acquired absence of both cervix and uterus: Secondary | ICD-10-CM | POA: Insufficient documentation

## 2024-08-20 DIAGNOSIS — M810 Age-related osteoporosis without current pathological fracture: Secondary | ICD-10-CM | POA: Insufficient documentation

## 2024-08-20 NOTE — Progress Notes (Signed)
 Hematology/Oncology Consult note [phone] [phone]        REFERRING PROVIDER: Bertrum Charlie CROME, MD   CHIEF COMPLAINTS/REASON FOR VISIT:  Evaluation of elevated light chian   ASSESSMENT & PLAN:   Elevated serum immunoglobulin free light chain level Slightly elevated kappa light chain with normal lambda level and ratio. No M proteins detected, reducing concern for multiple myeloma. Likely non-specific elevation due to chronic inflammation or autoimmune process. - Order 24-hour urine protein test  - check SPEP/IFE,  serum free light chain    Orders Placed This Encounter  Procedures   Multiple Myeloma Panel (SPEP&IFE w/QIG)    Standing Status:   Future    Number of Occurrences:   1    Expected Date:   08/20/2024    Expiration Date:   11/18/2024   Kappa/lambda light chains    Standing Status:   Future    Number of Occurrences:   1    Expected Date:   08/20/2024    Expiration Date:   11/18/2024   IFE+PROTEIN ELECTRO, 24-HR UR    Standing Status:   Future    Expected Date:   08/20/2024    Expiration Date:   11/18/2024    All questions were answered. The patient knows to call the clinic with any problems, questions or concerns.  Zelphia Cap, MD, PhD [facility] Hematology Oncology 08/20/2024   HISTORY OF PRESENTING ILLNESS:   Diane Gross is a  63 y.o.  female with PMH listed below was seen in consultation at the request of  Bertrum Charlie CROME, MD  for evaluation of elevated light chain level  Discussed the use of AI scribe software for clinical note transcription with the patient, who gave verbal consent to proceed.   She has a history of osteoporosis diagnosed at age 63 following a fall from a ski lift resulting in a compression fracture. She has been on various medications including Fosamax, Forteo, Reclast, Prolia, and currently Boniva since April 2025.  She reports that  family history is significant for bone disease; her mother was  diagnosed with a bone disease in her 66s, and her sister has osteosclerosis. 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  She follow up with endocrinologist. Recent blood work showed slightly increased kappa light chain 25, normal lamda leve, and kappa/lamda ratio. No M protein on SPEP.    MEDICAL HISTORY:  Past Medical History:  Diagnosis Date   Atrophic vaginitis    Cold sore    Hypertension    Osteopenia 2014   Osteoporosis    Thyroid disease    Hypothyroidism    SURGICAL HISTORY: Past Surgical History:  Procedure Laterality Date   ABDOMINAL HYSTERECTOMY     BACK SURGERY     CERVICAL BIOPSY  W/ LOOP ELECTRODE EXCISION     CHOLECYSTECTOMY     COLONOSCOPY  2012   COLPOSCOPY     TONSILLECTOMY AND ADENOIDECTOMY     TOTAL ABDOMINAL HYSTERECTOMY W/ BILATERAL SALPINGOOPHORECTOMY  1994   TAH BSO    SOCIAL HISTORY: Social History   Socioeconomic History   Marital status: Married    Spouse name: Not on file   Number of children: Not on file   Years of education: Not on file   Highest education level: Not on file  Occupational History  Not on file  Tobacco Use   Smoking status: Never   Smokeless tobacco: Never  Vaping Use   Vaping status: Never Used  Substance and Sexual Activity   Alcohol use: Yes    Comment: occ   Drug use: No   Sexual activity: Yes    Birth control/protection: Surgical    Comment: Hysterectomy  Other Topics Concern   Not on file  Social History Narrative   Not on file   Social Drivers of Health   Financial Resource Strain: Low Risk  (08/12/2024)   Received from [facility]   Overall Financial Resource Strain (CARDIA)    Difficulty of Paying Living Expenses: Not very hard  Food Insecurity: No Food Insecurity (08/12/2024)   Received from [facility]   Hunger Vital Sign    Within the past 12 months, you worried that your food would run out before you got the money to buy more.: Never true    Within the past 12 months, the food you bought just didn't last and you didn't have money to get more.: Never true  Transportation Needs: No Transportation Needs (08/12/2024)   Received from [facility] - Transportation    In the past 12 months, has lack of transportation kept you from medical appointments or from getting medications?: No    Lack of Transportation (Non-Medical): No  Physical Activity: Insufficiently Active (06/25/2024)   Received from [facility]   Exercise Vital Sign    On average, how many days per week do you engage in moderate to strenuous exercise (like a brisk walk)?: 3 days    On average, how many minutes do you engage in exercise at this level?: 30 min  Stress: No Stress Concern Present (10/15/2017)   [employer] of Occupational [employer] - Occupational Stress Questionnaire    Feeling of Stress : Not at all  Social Connections: Socially Integrated (10/15/2017)   Social Connection and Isolation Panel    Frequency of Communication with Friends and Family: More than three times a week    Frequency of Social Gatherings with Friends and Family: More than three times a week    Attends Religious Services: More than 4 times per year    Active Member of [employer] or Organizations: Yes    Attends Banker Meetings: More than 4 times per year    Marital Status: Married  Catering manager Violence: Not At Risk (10/15/2017)   Humiliation, Afraid, Rape, and Kick questionnaire    Fear of Current or Ex-Partner: No    Emotionally Abused: No    Physically Abused: No    Sexually Abused: No    FAMILY HISTORY: Family History  Problem Relation Age of Onset   Hypertension Mother    Heart disease Mother    Osteoporosis Mother    Cancer Father        lungs, malignant    Hypertension Sister    Hypertension Brother    Breast cancer Neg Hx     ALLERGIES:  has no known allergies.  MEDICATIONS:  Current Outpatient Medications  Medication Sig Dispense Refill   acetaminophen (TYLENOL) 500 MG tablet Take by mouth.     celecoxib (CELEBREX) 200 MG capsule Take 200 mg by mouth daily.     estradiol  (ESTRACE  VAGINAL) 0.1 MG/GM vaginal cream Insert 1 g vaginally nightly for 1 week, then once wkly as maintenance 42.5 g 1   ibandronate (BONIVA) 150 MG  tablet Take 150 mg by mouth.     levothyroxine (SYNTHROID) 75 MCG tablet Take 75 mcg by mouth.     losartan (COZAAR) 25 MG tablet Take 25 mg by mouth daily.     TURMERIC PO Take 1 each by mouth.     valACYclovir  (VALTREX ) 1000 MG tablet Take 2 tabs BID x 1 day prn sx 30 tablet 1   WEGOVY 1.7 MG/0.75ML SOAJ Inject 1.7 mg into the skin.     WEGOVY 1 MG/0.5ML SOAJ SQ injection Inject 1 mg into the skin. (Patient not taking: Reported on 08/20/2024)     No current facility-administered medications for this visit.    Review of Systems  Constitutional:  Negative for appetite change, chills, fatigue and fever.  HENT:   Negative for hearing loss and voice change.   Eyes:  Negative for eye problems.  Respiratory:  Negative for chest tightness and cough.   Cardiovascular:  Negative for chest pain.  Gastrointestinal:  Negative for abdominal distention, abdominal pain and blood in stool.  Endocrine: Negative for hot flashes.       Hypothyroidism   Genitourinary:  Negative for difficulty urinating and frequency.   Musculoskeletal:  Negative for arthralgias.  Skin:  Negative for itching and rash.  Neurological:  Negative for extremity weakness.  Hematological:  Negative for adenopathy.  Psychiatric/Behavioral:  Negative for confusion.    PHYSICAL EXAMINATION:  Vitals:   08/20/24 1453  BP: 130/62  Pulse: 66  Resp: 18  Temp: (!) 97.1 F (36.2 C)  SpO2: 99%   Filed Weights   08/20/24 1453  Weight: 162 lb 6.4 oz  (73.7 kg)    Physical Exam Constitutional:      General: She is not in acute distress. HENT:     Head: Normocephalic and atraumatic.  Eyes:     General: No scleral icterus. Cardiovascular:     Rate and Rhythm: Normal rate and regular rhythm.     Heart sounds: Normal heart sounds.  Pulmonary:     Effort: Pulmonary effort is normal. No respiratory distress.     Breath sounds: No wheezing.  Abdominal:     General: Bowel sounds are normal. There is no distension.     Palpations: Abdomen is soft.  Musculoskeletal:        General: No deformity. Normal range of motion.     Cervical back: Normal range of motion and neck supple.  Skin:    General: Skin is warm and dry.     Findings: No erythema or rash.  Neurological:     Mental Status: She is alert and oriented to person, place, and time. Mental status is at baseline.  Psychiatric:        Mood and Affect: Mood normal.     LABORATORY DATA:  I have reviewed the data as listed    Latest Ref Rng & Units 07/08/2014    4:35 AM 07/07/2014    4:34 AM 07/06/2014    4:57 AM  CBC  WBC 3.6 - 11.0 x10 3/mm 3 10.0  12.5  9.7   Hemoglobin 12.0 - 16.0 g/dL 89.3  88.1  88.9   Hematocrit 35.0 - 47.0 % 31.4  36.0  33.6   Platelets 150 - 440 x10 3/mm 3 260  312  238       Latest Ref Rng & Units 07/08/2014    4:35 AM 07/07/2014    4:34 AM 07/06/2014    4:57 AM  CMP  Glucose 65 -  99 mg/dL 81  855  92   BUN 7 - 18 mg/dL 11  9  11    Creatinine 0.60 - 1.30 mg/dL 9.09  9.11  9.10   Sodium 136 - 145 mmol/L 141  136  142   Potassium 3.5 - 5.1 mmol/L 3.6  4.1  3.5   Chloride 98 - 107 mmol/L 106  102  107   CO2 21 - 32 mmol/L 28  28  27    Calcium 8.5 - 10.1 mg/dL 8.1  8.8  7.6   Total Protein 6.4 - 8.2 g/dL 6.1  7.1  6.1   Total Bilirubin 0.2 - 1.0 mg/dL 0.2  0.3  0.3   Alkaline Phos Unit/L 103  153  97   AST 15 - 37 Unit/L 36  69  74   ALT U/L 47  73  50       RADIOGRAPHIC STUDIES: I have personally reviewed the radiological images as listed and  agreed with the findings in the report. MM 3D SCREENING MAMMOGRAM BILATERAL BREAST Result Date: 08/19/2024 CLINICAL DATA:  Screening. EXAM: DIGITAL SCREENING BILATERAL MAMMOGRAM WITH TOMOSYNTHESIS AND CAD TECHNIQUE: Bilateral screening digital craniocaudal and mediolateral oblique mammograms were obtained. Bilateral screening digital breast tomosynthesis was performed. The images were evaluated with computer-aided detection. COMPARISON:  Previous exam(s). ACR Breast Density Category b: There are scattered areas of fibroglandular density. FINDINGS: There are no findings suspicious for malignancy. IMPRESSION: No mammographic evidence of malignancy. A result letter of this screening mammogram will be mailed directly to the patient. RECOMMENDATION: Screening mammogram in one year. (Code:SM-B-01Y) BI-RADS CATEGORY  1: Negative. Electronically Signed   By: Dina  Arceo M.D.   On: 08/19/2024 19:04

## 2024-08-20 NOTE — Assessment & Plan Note (Signed)
 Slightly elevated kappa light chain with normal lambda level and ratio. No M proteins detected, reducing concern for multiple myeloma. Likely non-specific elevation due to chronic inflammation or autoimmune process. - Order 24-hour urine protein test  - check SPEP/IFE,  serum free light chain

## 2024-08-21 DIAGNOSIS — R779 Abnormality of plasma protein, unspecified: Secondary | ICD-10-CM | POA: Diagnosis not present

## 2024-08-21 LAB — KAPPA/LAMBDA LIGHT CHAINS
Kappa free light chain: 25.7 mg/L — ABNORMAL HIGH (ref 3.3–19.4)
Kappa, lambda light chain ratio: 1.25 (ref 0.26–1.65)
Lambda free light chains: 20.5 mg/L (ref 5.7–26.3)

## 2024-08-22 ENCOUNTER — Other Ambulatory Visit: Payer: Self-pay

## 2024-08-22 DIAGNOSIS — R7689 Other specified abnormal immunological findings in serum: Secondary | ICD-10-CM

## 2024-08-25 LAB — MULTIPLE MYELOMA PANEL, SERUM
Albumin SerPl Elph-Mcnc: 3.4 g/dL (ref 2.9–4.4)
Albumin/Glob SerPl: 1.2 (ref 0.7–1.7)
Alpha 1: 0.2 g/dL (ref 0.0–0.4)
Alpha2 Glob SerPl Elph-Mcnc: 0.6 g/dL (ref 0.4–1.0)
B-Globulin SerPl Elph-Mcnc: 1 g/dL (ref 0.7–1.3)
Gamma Glob SerPl Elph-Mcnc: 1.2 g/dL (ref 0.4–1.8)
Globulin, Total: 3 g/dL (ref 2.2–3.9)
IgA: 325 mg/dL (ref 87–352)
IgG (Immunoglobin G), Serum: 1043 mg/dL (ref 586–1602)
IgM (Immunoglobulin M), Srm: 61 mg/dL (ref 26–217)
Total Protein ELP: 6.4 g/dL (ref 6.0–8.5)

## 2024-08-27 LAB — IFE+PROTEIN ELECTRO, 24-HR UR
% BETA, Urine: 0 %
ALPHA 1 URINE: 0 %
Albumin, U: 100 %
Alpha 2, Urine: 0 %
GAMMA GLOBULIN URINE: 0 %
Total Protein, Urine-Ur/day: 86 mg/(24.h) (ref 30–150)
Total Protein, Urine: 8.2 mg/dL
Total Volume: 1050

## 2024-09-01 ENCOUNTER — Ambulatory Visit: Payer: Self-pay | Admitting: Oncology
